# Patient Record
Sex: Female | Born: 1945 | Race: White | Hispanic: No | Marital: Married | Smoking: Former smoker
Health system: Southern US, Community
[De-identification: ages and names within clinical notes are randomized; demographics above are authoritative.]

## PROBLEM LIST (undated history)

## (undated) DIAGNOSIS — F32A Depression, unspecified: Secondary | ICD-10-CM

## (undated) DIAGNOSIS — C801 Malignant (primary) neoplasm, unspecified: Secondary | ICD-10-CM

## (undated) DIAGNOSIS — F329 Major depressive disorder, single episode, unspecified: Secondary | ICD-10-CM

## (undated) DIAGNOSIS — H9192 Unspecified hearing loss, left ear: Secondary | ICD-10-CM

## (undated) DIAGNOSIS — E039 Hypothyroidism, unspecified: Secondary | ICD-10-CM

## (undated) DIAGNOSIS — I639 Cerebral infarction, unspecified: Secondary | ICD-10-CM

## (undated) DIAGNOSIS — I2510 Atherosclerotic heart disease of native coronary artery without angina pectoris: Secondary | ICD-10-CM

## (undated) DIAGNOSIS — K219 Gastro-esophageal reflux disease without esophagitis: Secondary | ICD-10-CM

## (undated) DIAGNOSIS — M1990 Unspecified osteoarthritis, unspecified site: Secondary | ICD-10-CM

## (undated) DIAGNOSIS — I10 Essential (primary) hypertension: Secondary | ICD-10-CM

## (undated) DIAGNOSIS — Z8719 Personal history of other diseases of the digestive system: Secondary | ICD-10-CM

## (undated) HISTORY — PX: WRIST SURGERY: SHX841

## (undated) HISTORY — PX: ANKLE SURGERY: SHX546

## (undated) HISTORY — PX: APPENDECTOMY: SHX54

## (undated) HISTORY — PX: CHOLECYSTECTOMY: SHX55

## (undated) HISTORY — PX: OTHER SURGICAL HISTORY: SHX169

## (undated) HISTORY — PX: TONSILLECTOMY: SUR1361

## (undated) HISTORY — PX: CARDIAC CATHETERIZATION: SHX172

---

## 2018-12-19 HISTORY — PX: CATARACT EXTRACTION: SUR2

## 2018-12-31 ENCOUNTER — Other Ambulatory Visit: Payer: Self-pay | Admitting: Neurosurgery

## 2018-12-31 ENCOUNTER — Other Ambulatory Visit: Payer: Self-pay | Admitting: Surgical Oncology

## 2018-12-31 ENCOUNTER — Other Ambulatory Visit (HOSPITAL_BASED_OUTPATIENT_CLINIC_OR_DEPARTMENT_OTHER): Payer: Self-pay | Admitting: Neurosurgery

## 2018-12-31 DIAGNOSIS — M25552 Pain in left hip: Secondary | ICD-10-CM

## 2018-12-31 DIAGNOSIS — M5416 Radiculopathy, lumbar region: Secondary | ICD-10-CM

## 2019-01-05 ENCOUNTER — Ambulatory Visit (HOSPITAL_BASED_OUTPATIENT_CLINIC_OR_DEPARTMENT_OTHER)
Admission: RE | Admit: 2019-01-05 | Discharge: 2019-01-05 | Disposition: A | Discharge status: Home / Self Care | Payer: Medicare Other | Source: Ambulatory Visit | Attending: Neurosurgery | Admitting: Neurosurgery

## 2019-01-05 DIAGNOSIS — M5416 Radiculopathy, lumbar region: Secondary | ICD-10-CM | POA: Diagnosis present

## 2019-01-05 DIAGNOSIS — M25552 Pain in left hip: Secondary | ICD-10-CM | POA: Diagnosis present

## 2019-01-24 ENCOUNTER — Other Ambulatory Visit: Payer: Self-pay | Admitting: Neurosurgery

## 2019-02-15 NOTE — Pre-Procedure Instructions (Signed)
Sherene Plancarte  02/15/2019      [id number] - [place], [place] - [address] AT [address] [place] [address] Phone: [phone] Fax: [phone]    Your procedure is scheduled on March 10  Report to [facility] Admitting at 1010 A.M.  Call this number if you have problems the morning of surgery:  [phone]   Remember:  Do not eat or drink after midnight.    Take these medicines the morning of surgery with A SIP OF WATER  acetaminophen (TYLENOL) albuterol (PROVENTIL) (2.5 MG/3ML)  BREO ELLIPTA  DULoxetine (CYMBALTA) fluticasone (FLONASE) pantoprazole (PROTONIX)  SYNTHROID    7 days prior to surgery STOP taking any Aspirin (unless otherwise instructed by your surgeon), Aleve, Naproxen, Ibuprofen, Motrin, Advil, Goody's, BC's, all herbal medications, fish oil, and all vitamins.  Follow your surgeon's instructions on when to stop Asprin.  If no instructions were given by your surgeon then you will need to call the office to get those instructions.     Do not wear jewelry, make-up or nail polish.  Do not wear lotions, powders, or perfumes, or deodorant.  Do not shave 48 hours prior to surgery.    Do not bring valuables to the hospital.  [facility] is not responsible for any belongings or valuables.  Contacts, dentures or bridgework may not be worn into surgery.  Leave your suitcase in the car.  After surgery it may be brought to your room.  For patients admitted to the hospital, discharge time will be determined by your treatment team.  Patients discharged the day of surgery will not be allowed to drive home.    Special instructions:   [place]- Preparing For Surgery  Before surgery, you can play an important role. Because skin is not sterile, your skin needs to be as free of germs as possible. You can reduce the number of germs on your skin by washing with CHG (chlorahexidine gluconate) Soap  before surgery.  CHG is an antiseptic cleaner which kills germs and bonds with the skin to continue killing germs even after washing.    Oral Hygiene is also important to reduce your risk of infection.  Remember - BRUSH YOUR TEETH THE MORNING OF SURGERY WITH YOUR REGULAR TOOTHPASTE  Please do not use if you have an allergy to CHG or antibacterial soaps. If your skin becomes reddened/irritated stop using the CHG.  Do not shave (including legs and underarms) for at least 48 hours prior to first CHG shower. It is OK to shave your face.  Please follow these instructions carefully.   1. Shower the NIGHT BEFORE SURGERY and the MORNING OF SURGERY with CHG.   2. If you chose to wash your hair, wash your hair first as usual with your normal shampoo.  3. After you shampoo, rinse your hair and body thoroughly to remove the shampoo.  4. Use CHG as you would any other liquid soap. You can apply CHG directly to the skin and wash gently with a scrungie or a clean washcloth.   5. Apply the CHG Soap to your body ONLY FROM THE NECK DOWN.  Do not use on open wounds or open sores. Avoid contact with your eyes, ears, mouth and genitals (private parts). Wash Face and genitals (private parts)  with your normal soap.  6. Wash thoroughly, paying special attention to the area where your surgery will be performed.  7. Thoroughly rinse your body with warm water from the neck down.  8. DO NOT shower/wash with your normal soap after using and rinsing off the CHG Soap.  9. Pat yourself dry with a CLEAN TOWEL.  10. Wear CLEAN PAJAMAS to bed the night before surgery, wear comfortable clothes the morning of surgery  11. Place CLEAN SHEETS on your bed the night of your first shower and DO NOT SLEEP WITH PETS.    Day of Surgery:  Do not apply any deodorants/lotions.  Please wear clean clothes to the hospital/surgery center.   Remember to brush your teeth WITH YOUR REGULAR TOOTHPASTE.    Please read over the  following fact sheets that you were given.

## 2019-02-18 ENCOUNTER — Other Ambulatory Visit: Payer: Self-pay | Admitting: Neurosurgery

## 2019-02-18 ENCOUNTER — Other Ambulatory Visit: Payer: Self-pay

## 2019-02-18 ENCOUNTER — Encounter (HOSPITAL_COMMUNITY)
Admission: RE | Admit: 2019-02-18 | Discharge: 2019-02-18 | Disposition: A | Discharge status: Home / Self Care | Payer: Medicare Other | Source: Ambulatory Visit | Attending: Neurosurgery | Admitting: Neurosurgery

## 2019-02-18 ENCOUNTER — Encounter (HOSPITAL_COMMUNITY): Payer: Self-pay

## 2019-02-18 DIAGNOSIS — Z01812 Encounter for preprocedural laboratory examination: Secondary | ICD-10-CM | POA: Diagnosis present

## 2019-02-18 HISTORY — DX: Malignant (primary) neoplasm, unspecified: C80.1

## 2019-02-18 HISTORY — DX: Cerebral infarction, unspecified: I63.9

## 2019-02-18 HISTORY — DX: Major depressive disorder, single episode, unspecified: F32.9

## 2019-02-18 HISTORY — DX: Hypothyroidism, unspecified: E03.9

## 2019-02-18 HISTORY — DX: Unspecified osteoarthritis, unspecified site: M19.90

## 2019-02-18 HISTORY — DX: Personal history of other diseases of the digestive system: Z87.19

## 2019-02-18 HISTORY — DX: Atherosclerotic heart disease of native coronary artery without angina pectoris: I25.10

## 2019-02-18 HISTORY — DX: Depression, unspecified: F32.A

## 2019-02-18 HISTORY — DX: Essential (primary) hypertension: I10

## 2019-02-18 HISTORY — DX: Unspecified hearing loss, left ear: H91.92

## 2019-02-18 HISTORY — DX: Gastro-esophageal reflux disease without esophagitis: K21.9

## 2019-02-18 LAB — BASIC METABOLIC PANEL
Anion gap: 10 (ref 5–15)
BUN: 34 mg/dL — AB (ref 8–23)
CO2: 23 mmol/L (ref 22–32)
Calcium: 9.3 mg/dL (ref 8.9–10.3)
Chloride: 105 mmol/L (ref 98–111)
Creatinine, Ser: 0.94 mg/dL (ref 0.44–1.00)
GFR calc Af Amer: >60 mL/min (ref >60)
GFR calc non Af Amer: 60 mL/min — ABNORMAL LOW (ref >60)
Glucose, Bld: 139 mg/dL — ABNORMAL HIGH (ref 70–99)
Potassium: 3.6 mmol/L (ref 3.5–5.1)
Sodium: 138 mmol/L (ref 135–145)

## 2019-02-18 LAB — CBC
HCT: 40.8 % (ref 36.0–46.0)
Hemoglobin: 13.2 g/dL (ref 12.0–15.0)
MCH: 30.1 pg (ref 26.0–34.0)
MCHC: 32.4 g/dL (ref 30.0–36.0)
MCV: 93.2 fL (ref 80.0–100.0)
Platelets: 362 10*3/uL (ref 150–400)
RBC: 4.38 MIL/uL (ref 3.87–5.11)
RDW: 13.2 % (ref 11.5–15.5)
WBC: 9 10*3/uL (ref 4.0–10.5)
nRBC: 0 % (ref 0.0–0.2)

## 2019-02-18 LAB — SURGICAL PCR SCREEN
MRSA, PCR: NEGATIVE
Staphylococcus aureus: NEGATIVE

## 2019-02-18 NOTE — Progress Notes (Signed)
Blood bank called to notify that patient is positive for antibodies. Will need recollection on DOS. Blood bank states to use same armband but obtain a new sample.   Jacqlyn Larsen, RN

## 2019-02-18 NOTE — Progress Notes (Signed)
PCP - Dr. Yong Channel Cardiologist - Dr. Theron Arista [facility]. Pt states that she has received cardiac clearance that was sent to [facility].  Chest x-ray - N/A EKG - 02/09/19-requested from Dr. Rhetta Mura office.  Stress Test - 04/11/18-CE ECHO - 06/04/18-CE; pt states having another ECHO 12/2018. Requested from [place] records.  Cardiac Cath - 15+ years ago; states 2 stents were placed.  Sleep Study - denies CPAP - denies  Blood Thinner Instructions: N/A Aspirin Instructions:Per cardiologist; hold 10 days prior to procedure. LD 02/15/19 per pt.   Anesthesia review: Yes, heart history and pending records.   Patient denies shortness of breath, fever, cough and chest pain at PAT appointment   Patient verbalized understanding of instructions that were given to them at the PAT appointment. Patient was also instructed that they will need to review over the PAT instructions again at home before surgery.

## 2019-02-19 NOTE — Progress Notes (Addendum)
Anesthesia Chart Review:  Case:  [id number] Date/Time:  02/26/19 1159   Procedure:  Left Lumbar 4-5 Transforaminal lumbar interbody fusion, possible posterior (Left Back) - Left Lumbar 4-5 Transforaminal lumbar interbody fusion, possible posterior   Anesthesia type:  General   Pre-op diagnosis:  Spondylolisthesis, Lumbar region   Location:  [facility] OR ROOM 21 / [facility]   Surgeon:  Erline Levine, MD      DISCUSSION: Patient is a 73 year old female scheduled for the above procedure.  History includes never smoker, CAD (s/p LAD stents x2 2006), HTN, post-ablation hypothyroidism, CVA 2018, left ear deafness, GERD, hiatal hernia, skin cancer (SCC, right forearm 08/2018), dyslipidemia (intolerant to statins). - Admitted 01/29/19-01/30/19 for recurrent syncope (referred to ED from PCP office). 01/29/19. BP range from 99/59-192/101. Syncope felt likely due to orthostatic hypotension. By notes EKG showed prolonged QT. Head CT negative for acute findings. Echo ordered and out-patient follow-up for consideration of loop recorder. Normal LVEF without significant valvular abnormality on echo.  - She was seen by cardiologist Dr. Wyline Copas on 02/08/19 for preoperative cardiology evaluation. He thought she may proceed with surgery at "acceptable risk."  She reported last ASA 02/15/19 per cardiologist.  Although patient has cardiac clearance, his note does not mention recent admission for syncope x4 or consideration of cardiac monitoring or loop recorder. I have reached out to Dr. Rhetta Mura office to clarify nothing additional needed prior to surgery. I was told his last day there was on 02/15/19, but nursing staff will contact me for follow-up.    ADDENDUM 02/25/19 1:06 PM: Cardiologist Mathis Bud, MD advised that "if she has indeed had 4 syncopal episodes in that period of time that she needs a 30-day event monitor placed and then to be cleared by someone in the office after that information is available." Janett Billow spoke with patient  who insists that Dr Wyline Copas was aware of syncope and thought it was related to dehydration and vertigo, and since increasing her fluid intake has not had additional syncope. Discussed with anesthesiologist Suzette Battiest, MD. Patient can discuss further with covering cardiologist. If clearance for surgery is confirmed without additional preoperative testing then would anticipate that she can proceed as scheduled, but if preoperative event monitor is still recommended then elective surgery would need to be postponed. Jessica at Dr. Donald Pore' office again updated.   VS: BP 138/68   Pulse 88   Temp (!) 36.4 C   Resp 20   Ht 5\' 5"  (1.651 m)   Wt 87.9 kg   SpO2 100%   BMI 32.25 kg/m    PROVIDERS: Kristopher Glee., MD is PCP ([place]) Sherryl Barters, MD is cardiologist ([place])   LABS: Labs reviewed: Acceptable for surgery. (all labs ordered are listed, but only abnormal results are displayed)  Labs Reviewed  BASIC METABOLIC PANEL - Abnormal; Notable for the following components:      Result Value   Glucose, Bld 139 (*)    BUN 34 (*)    GFR calc non Af Amer 60 (*)    All other components within normal limits  SURGICAL PCR SCREEN  CBC  TYPE AND SCREEN    IMAGES: CXR 01/29/19 ([facility] [place]): FINDINGS: The heart size and mediastinal contours are within normal limits. Mild uncoiling of the thoracic aorta with aortic atherosclerosis. No aneurysm. Both lungs are clear. Chronic mild elevation of the right hemidiaphragm. Osteoarthritis of the included AC joints. No acute nor suspicious osseous  abnormality. Degenerative changes are present along the dorsal spine. IMPRESSION: No active cardiopulmonary disease.  CT head 01/29/19 [facility] [place]): IMPRESSION: Chronic ischemic change without acute abnormality.   EKG: 02/08/19 [facility] Poor baseline.  Sinus rhythm.  Possible LVH.   Otherwise normal.  When compared to ECG from 01/29/19 16: 58, premature atrial complexes are no longer present.  QT has shortened (QT 406/QTc 438 ms).  Confirmed by Mathis Bud, [facility]   CV: Echo 01/30/19: Summary Technically difficult and limited study. Grossly normal left ventricular function. Ejection fraction is visually estimated at 60-65% No significant valvular abnormalities.  Nuclear stress test 04/11/18 [facility] [place]) : Summary Both stress and rest images demonstrate normal uptake in all walls. The calculated EF is 67%. There is no evidence of inducible ischemia on this study.   Past Medical History:  Diagnosis Date  . Arthritis   . Cancer (HCC)    squamous cell carcinoma  . Coronary artery disease   . Deafness in left ear   . Depression   . GERD (gastroesophageal reflux disease)   . History of hiatal hernia   . Hypertension   . Hypothyroidism    thyroid ablation 20 years ago  . Stroke [facility])    2018-"2 minor strokes"    Past Surgical History:  Procedure Laterality Date  . ANKLE SURGERY Right   . ANKLE SURGERY Left   . APPENDECTOMY    . CARDIAC CATHETERIZATION     stents x2; 20+ years ago  . CESAREAN SECTION     x2  . CHOLECYSTECTOMY    . thyroid ablation    . TONSILLECTOMY    . [facility] in [place], [place] Left    Done in [place], [place]    MEDICATIONS: . acetaminophen (TYLENOL) 500 MG tablet  . albuterol (PROVENTIL) (2.5 MG/3ML) 0.083% nebulizer solution  . ASPIRIN LOW DOSE 81 MG EC tablet  . BREO ELLIPTA 100-25 MCG/INH AEPB  . Cholecalciferol (VITAMIN D) 50 MCG (2000 UT) CAPS  . DULoxetine (CYMBALTA) 30 MG capsule  . DULoxetine (CYMBALTA) 60 MG capsule  . fluticasone (FLONASE) 50 MCG/ACT nasal spray  . losartan (COZAAR) 50 MG tablet  . naproxen sodium (ALEVE) 220 MG tablet  . pantoprazole (PROTONIX) 40 MG tablet  . PRALUENT 75 MG/ML SOAJ  . pyridOXINE (VITAMIN B-6) 100 MG tablet  .  SYNTHROID 112 MCG tablet  . vitamin B-12 (CYANOCOBALAMIN) 1000 MCG tablet   No current facility-administered medications for this encounter.     Myra Gianotti, PA-C Surgical Short Stay/Anesthesiology [facility] Phone [phone] [facility] Phone [phone] 02/19/2019 2:45 PM

## 2019-02-23 NOTE — H&P (Signed)
Patient ID:   [id number] Patient: Jane Daniels  Date of Birth: 04-05-1946 Visit Type: Office Visit   Date: 01/23/2019 01:15 PM Provider: Marchia Meiers. Vertell Limber MD   This 73 year old female presents for back and leg /MRI review.  HISTORY OF PRESENT ILLNESS:  1.  back and leg /MRI review  Patient returns to review lumbar and hip MRI  Lumbar MRI demonstrates L4-5 spondylolisthesis.  The patient has severe spinal stenosis at this level.  She does have some degenerative changes at the L5-S1 facet joints but these are not terribly severe.  She has significant nerve root compression left greater than right at the L4-5 level.  The patient is continuing to have significant back and left leg pain.  She has left EHL at 4/5.  Based on these imaging findings and her persistent pain and weakness I have recommended the patient undergo surgery.  This will consist of a left L4-5 TLIF versus PLIF.  Risks and benefits were discussed in detail with the patient and she wished to proceed for surgery.  This has been scheduled for March 10th, 2020.         Medical/Surgical/Interim History Reviewed, no change.  Last detailed document date:12/28/2018.     PAST MEDICAL HISTORY, SURGICAL HISTORY, FAMILY HISTORY, SOCIAL HISTORY AND REVIEW OF SYSTEMS I have reviewed the patient's past medical, surgical, family and social history as well as the comprehensive review of systems as included on the [facility] history form dated 12/28/2018, which I have signed.  Family History:  Reviewed, no changes.  Last detailed document date:12/28/2018.   Social History: Reviewed, no changes. Last detailed document date: 12/28/2018.    MEDICATIONS: (added, continued or stopped this visit) Started Medication Directions Instruction Stopped   albuterol sulfate 2.5 mg/3 mL (0.083 %) solution for nebulization inhale 3 milliliter by nebulization route 3 times every day     aspirin 81 mg tablet,delayed  release take 1 tablet by oral route  every day     Breo Ellipta 100 mcg-25 mcg/dose powder for inhalation inhale 1 puff by inhalation route  every day at the same time each day     clonazepam 0.5 mg tablet take 1 tablet by oral route  every day     Cymbalta 60 mg capsule,delayed release take 1 capsule by oral route  every day    12/28/2018 Flonase Allergy Relief 50 mcg/actuation nasal spray,suspension spray 1 - 2 spray by intranasal route  every day in each nostril as needed     hydrocodone 5 mg-acetaminophen 325 mg tablet take 1 tablet by oral route  every 6 hours as needed for pain     losartan 50 mg tablet take 1 tablet by oral route  every day     pantoprazole 40 mg tablet,delayed release take 1 tablet by oral route  every day     Praluent Pen 75 mg/mL subcutaneous pen injector inject 1 milliliter by subcutaneous route  every 2 weeks in the abdomen, thigh, or upper arm rotating injection sites     ProAir HFA 90 mcg/actuation aerosol inhaler inhale 2 puff by inhalation route  every 4 - 6 hours as needed     Tirosint 112 mcg capsule take 1 capsule by oral route  every day     Vitamin B-6 25 mg tablet      Vitamin D3 1,000 unit capsule        ALLERGIES: Ingredient Reaction Medication Name Comment  FENTANYL  AMLODIPINE     HYDROMORPHONE HCL  Dilaudid   METOLAZONE  Zaroxolyn   PREGABALIN     LISINOPRIL         PHYSICAL EXAM:   Vitals Date Temp F BP Pulse Ht In Wt Lb BMI BSA Pain Score  01/23/2019  169/94 98 65.5 193 31.63  4/10      IMPRESSION:   Lumbar stenosis with spondylolisthesis and persistent left leg pain with weakness.  PLAN:  Proceed with decompression and fusion L4-5 level either left TLIF or PLIF.  Risks and benefits were discussed in detail with patient and she wished to proceed.  She was fitted for lumbosacral orthosis today.  Orders: Diagnostic Procedures: Assessment Procedure  M54.16 Lumbar Spine- AP/Lat  Instruction(s)/Education: Assessment  Instruction  I10 Hypertension education  Z68.31 Lifestyle education regarding diet  Miscellaneous: Assessment   M43.16 Aspen Lo Sag Rigid Panel Quick   Assessment/Plan   # Detail Type Description   1. Assessment Low back pain, unspecified back pain laterality, with sciatica presence unspecified (M54.5).       2. Assessment Lumbar foraminal stenosis (M48.061).       3. Assessment Spondylolisthesis, lumbar region (M43.16).   Plan Orders Aspen Lo Sag Rigid Panel Quick.       4. Assessment Lumbar radiculopathy (M54.16).       5. Assessment Essential (primary) hypertension (I10).       6. Assessment Body mass index (BMI) 31.0-31.9, adult (Z68.31).   Plan Orders Today's instructions / counseling include(s) Lifestyle education regarding diet. Clinical information/comments: Patient encouraged to eat a well balance diet.         Pain Management Plan Pain Scale: 4/10. Method: Numeric Pain Intensity Scale.  Fall Risk Plan The patient has not fallen in the last year. Falls risk follow-up plan of care: Assisted devices: Patient taking medication as prescribed.              Provider:  Marchia Meiers. Vertell Limber MD  01/26/2019 03:52 PM Dictation edited by: Marchia Meiers. Vertell Limber    CC Providers: Yong Channel [address] [place],  [place]  [phone]   [place]  [address] [place], [place] [address]               Electronically signed by Marchia Meiers. Vertell Limber MD on 01/26/2019 03:52 PM

## 2019-02-23 NOTE — H&P (View-Only) (Signed)
Patient ID:   [id number] Patient: Jane Daniels  Date of Birth: 02-20-1946 Visit Type: Office Visit   Date: 01/23/2019 01:15 PM Provider: Marchia Meiers. Vertell Limber MD   This 73 year old female presents for back and leg /MRI review.  HISTORY OF PRESENT ILLNESS:  1.  back and leg /MRI review  Patient returns to review lumbar and hip MRI  Lumbar MRI demonstrates L4-5 spondylolisthesis.  The patient has severe spinal stenosis at this level.  She does have some degenerative changes at the L5-S1 facet joints but these are not terribly severe.  She has significant nerve root compression left greater than right at the L4-5 level.  The patient is continuing to have significant back and left leg pain.  She has left EHL at 4/5.  Based on these imaging findings and her persistent pain and weakness I have recommended the patient undergo surgery.  This will consist of a left L4-5 TLIF versus PLIF.  Risks and benefits were discussed in detail with the patient and she wished to proceed for surgery.  This has been scheduled for March 10th, 2020.         Medical/Surgical/Interim History Reviewed, no change.  Last detailed document date:12/28/2018.     PAST MEDICAL HISTORY, SURGICAL HISTORY, FAMILY HISTORY, SOCIAL HISTORY AND REVIEW OF SYSTEMS I have reviewed the patient's past medical, surgical, family and social history as well as the comprehensive review of systems as included on the [facility] history form dated 12/28/2018, which I have signed.  Family History:  Reviewed, no changes.  Last detailed document date:12/28/2018.   Social History: Reviewed, no changes. Last detailed document date: 12/28/2018.    MEDICATIONS: (added, continued or stopped this visit) Started Medication Directions Instruction Stopped   albuterol sulfate 2.5 mg/3 mL (0.083 %) solution for nebulization inhale 3 milliliter by nebulization route 3 times every day     aspirin 81 mg tablet,delayed  release take 1 tablet by oral route  every day     Breo Ellipta 100 mcg-25 mcg/dose powder for inhalation inhale 1 puff by inhalation route  every day at the same time each day     clonazepam 0.5 mg tablet take 1 tablet by oral route  every day     Cymbalta 60 mg capsule,delayed release take 1 capsule by oral route  every day    12/28/2018 Flonase Allergy Relief 50 mcg/actuation nasal spray,suspension spray 1 - 2 spray by intranasal route  every day in each nostril as needed     hydrocodone 5 mg-acetaminophen 325 mg tablet take 1 tablet by oral route  every 6 hours as needed for pain     losartan 50 mg tablet take 1 tablet by oral route  every day     pantoprazole 40 mg tablet,delayed release take 1 tablet by oral route  every day     Praluent Pen 75 mg/mL subcutaneous pen injector inject 1 milliliter by subcutaneous route  every 2 weeks in the abdomen, thigh, or upper arm rotating injection sites     ProAir HFA 90 mcg/actuation aerosol inhaler inhale 2 puff by inhalation route  every 4 - 6 hours as needed     Tirosint 112 mcg capsule take 1 capsule by oral route  every day     Vitamin B-6 25 mg tablet      Vitamin D3 1,000 unit capsule        ALLERGIES: Ingredient Reaction Medication Name Comment  FENTANYL  AMLODIPINE     HYDROMORPHONE HCL  Dilaudid   METOLAZONE  Zaroxolyn   PREGABALIN     LISINOPRIL         PHYSICAL EXAM:   Vitals Date Temp F BP Pulse Ht In Wt Lb BMI BSA Pain Score  01/23/2019  169/94 98 65.5 193 31.63  4/10      IMPRESSION:   Lumbar stenosis with spondylolisthesis and persistent left leg pain with weakness.  PLAN:  Proceed with decompression and fusion L4-5 level either left TLIF or PLIF.  Risks and benefits were discussed in detail with patient and she wished to proceed.  She was fitted for lumbosacral orthosis today.  Orders: Diagnostic Procedures: Assessment Procedure  M54.16 Lumbar Spine- AP/Lat  Instruction(s)/Education: Assessment  Instruction  I10 Hypertension education  Z68.31 Lifestyle education regarding diet  Miscellaneous: Assessment   M43.16 Aspen Lo Sag Rigid Panel Quick   Assessment/Plan   # Detail Type Description   1. Assessment Low back pain, unspecified back pain laterality, with sciatica presence unspecified (M54.5).       2. Assessment Lumbar foraminal stenosis (M48.061).       3. Assessment Spondylolisthesis, lumbar region (M43.16).   Plan Orders Aspen Lo Sag Rigid Panel Quick.       4. Assessment Lumbar radiculopathy (M54.16).       5. Assessment Essential (primary) hypertension (I10).       6. Assessment Body mass index (BMI) 31.0-31.9, adult (Z68.31).   Plan Orders Today's instructions / counseling include(s) Lifestyle education regarding diet. Clinical information/comments: Patient encouraged to eat a well balance diet.         Pain Management Plan Pain Scale: 4/10. Method: Numeric Pain Intensity Scale.  Fall Risk Plan The patient has not fallen in the last year. Falls risk follow-up plan of care: Assisted devices: Patient taking medication as prescribed.              Provider:  Marchia Meiers. Vertell Limber MD  01/26/2019 03:52 PM Dictation edited by: Marchia Meiers. Vertell Limber    CC Providers: Yong Channel [address] [place],  [place]  [phone]   [place]  [address] [place], [place] [address]               Electronically signed by Marchia Meiers. Vertell Limber MD on 01/26/2019 03:52 PM

## 2019-02-25 NOTE — Anesthesia Preprocedure Evaluation (Addendum)
Anesthesia Evaluation  Patient identified by MRN, date of birth, ID band Patient awake    Reviewed: Allergy & Precautions, NPO status , Patient's Chart, lab work & pertinent test results  Airway Mallampati: II  TM Distance: >3 FB Neck ROM: Full    Dental  (+) Edentulous Upper, Missing,    Pulmonary neg pulmonary ROS,    Pulmonary exam normal breath sounds clear to auscultation       Cardiovascular hypertension, Pt. on medications + CAD and + Cardiac Stents  Normal cardiovascular exam Rhythm:Regular Rate:Normal  Sees cardiologist ([place])/ ([place])    Neuro/Psych PSYCHIATRIC DISORDERS Depression Deafness in left ear TIACVA, No Residual Symptoms    GI/Hepatic Neg liver ROS, hiatal hernia, GERD  Medicated and Controlled,  Endo/Other  Hypothyroidism   Renal/GU negative Renal ROS     Musculoskeletal negative musculoskeletal ROS (+)   Abdominal (+) + obese,   Peds  Hematology negative hematology ROS (+)   Anesthesia Other Findings Spondylolisthesis, Lumbar region  Reproductive/Obstetrics                          Anesthesia Physical Anesthesia Plan  ASA: III  Anesthesia Plan: General   Post-op Pain Management:    Induction: Intravenous  PONV Risk Score and Plan: 4 or greater and Ondansetron, Dexamethasone, Midazolam and Treatment may vary due to age or medical condition  Airway Management Planned: Oral ETT  Additional Equipment:   Intra-op Plan:   Post-operative Plan: Extubation in OR  Informed Consent: I have reviewed the patients History and Physical, chart, labs and discussed the procedure including the risks, benefits and alternatives for the proposed anesthesia with the patient or authorized representative who has indicated his/her understanding and acceptance.     Dental advisory given  Plan Discussed with: CRNA  Anesthesia Plan Comments: (Reviewed TWO NOTES by Myra Gianotti, PA-C. Question of propofol intolerance noted in 02/25/19 cardiology notes, but not documented from her PAT visit. Patient took acetaminophen 1000 mg at 0800 on 02/26/19)      Anesthesia Quick Evaluation

## 2019-02-25 NOTE — Progress Notes (Signed)
Anesthesia follow-up: See my PAT APP note from encounter 02/18/19. Since then patient spoke with cardiologist Dr. Vaughan Browner nurse Jane Daniels. As mentioned in my previous note, patient insists that Dr. Earlie Daniels was aware of syncope and thought it was related to dehydration. (This is supported by Dr. Vincente Daniels 12/17/18 note, although he does not make mention of subsequent four episodes in his 02/08/19--although he does mention the echo results which was done as part of her overnight work-up for syncope 01/29/19.).   Per Jane Daniels, Dr. Beatrix Daniels initially thought question regarding event monitor pertained to four episodes that occurred after her 02/08/19 visit with Dr. Earlie Daniels when in fact, he had seen patient after the episodes. Also per Jane Daniels, since Dr. Earlie Daniels had seen patient following her syncopal events and had cleared her for surgery and because she had not had any recurrent symptoms then Dr. Beatrix Daniels did not feel that patient needed to undergo event monitoring prior to surgery and that Dr. Vincente Daniels original clearance should still be valid. Per Dr. Melven Sartorius PA Jane Daniels, Dr. Beatrix Daniels communicated this to him personally as well.  I had discussed with anesthesiologist Jane Battiest, MD the back and forth communication with myself, Dr. Melven Sartorius scheduler and his PA, and Dr. Beatrix Daniels and his staff. Dr. Ola Daniels wanted Dr. Beatrix Daniels to clarify that event monitor was not needed prior to surgery. I notified Jane Daniels at Dr. Melven Sartorius office of this. Since then notation by Jane Klinefelter, RN (see Jane Daniels) wrote: "I spoke with the patient and reviewed Dr. Rhetta Daniels last couple of OV notes her syncopal episodes happened over the last 4 months all occurring prior to her visit with Dr. Wyline Daniels on 02/08/2019 therefore she is considered pre-op assessed and is acceptable risk per Dr. Wyline Daniels for back (lumbar fusion) surgery. Her "syncopal" episodes were due to different reasons, dehydration on one occasion, vertigo causing a fall where she hit her head, and  reaction to medication she reported as being given propofol despite her being intolerant/allergic to it. I informed Jane Daniels with anesthesia and the surgeon was notified by Dr. Beatrix Daniels of this as they paged him about this. Patient is also aware to arrive for surgery tomorrow unless the surgeon call and tell her otherwise."  Anesthesiologist will evaluate on the day of surgery and review available records. Definitive plan at that time. Of note, patient did not mention intolerance to propofol at her PAT visit, so I was not aware of any issues until late this afternoon, so anesthesiologist can inquire further upon his/her interview.  Jane Gianotti, PA-C Surgical Short Stay/Anesthesiology [facility] Phone [phone] [facility] Phone [phone] 02/25/2019 5:13 PM

## 2019-02-26 ENCOUNTER — Inpatient Hospital Stay (HOSPITAL_COMMUNITY): Payer: Medicare Other

## 2019-02-26 ENCOUNTER — Encounter (HOSPITAL_COMMUNITY): Admission: RE | Payer: Self-pay | Source: Home / Self Care

## 2019-02-26 ENCOUNTER — Observation Stay (HOSPITAL_COMMUNITY)
Admission: RE | Admit: 2019-02-26 | Discharge: 2019-02-27 | Disposition: A | Discharge status: Home / Self Care | Payer: Medicare Other | Attending: Neurosurgery | Admitting: Neurosurgery

## 2019-02-26 ENCOUNTER — Inpatient Hospital Stay (HOSPITAL_COMMUNITY): Payer: Medicare Other | Admitting: Vascular Surgery

## 2019-02-26 ENCOUNTER — Encounter (HOSPITAL_COMMUNITY): Payer: Self-pay

## 2019-02-26 ENCOUNTER — Encounter (HOSPITAL_COMMUNITY)
Admission: RE | Disposition: A | Discharge status: Home / Self Care | Payer: Self-pay | Source: Home / Self Care | Attending: Neurosurgery

## 2019-02-26 ENCOUNTER — Other Ambulatory Visit: Payer: Self-pay

## 2019-02-26 ENCOUNTER — Inpatient Hospital Stay (HOSPITAL_COMMUNITY): Admission: RE | Admit: 2019-02-26 | Payer: Medicare Other | Source: Home / Self Care | Admitting: Neurosurgery

## 2019-02-26 DIAGNOSIS — I2510 Atherosclerotic heart disease of native coronary artery without angina pectoris: Secondary | ICD-10-CM | POA: Diagnosis not present

## 2019-02-26 DIAGNOSIS — M4316 Spondylolisthesis, lumbar region: Principal | ICD-10-CM | POA: Insufficient documentation

## 2019-02-26 DIAGNOSIS — Z419 Encounter for procedure for purposes other than remedying health state, unspecified: Secondary | ICD-10-CM

## 2019-02-26 DIAGNOSIS — H9192 Unspecified hearing loss, left ear: Secondary | ICD-10-CM | POA: Diagnosis not present

## 2019-02-26 DIAGNOSIS — F329 Major depressive disorder, single episode, unspecified: Secondary | ICD-10-CM | POA: Diagnosis not present

## 2019-02-26 DIAGNOSIS — Z7982 Long term (current) use of aspirin: Secondary | ICD-10-CM | POA: Insufficient documentation

## 2019-02-26 DIAGNOSIS — M5416 Radiculopathy, lumbar region: Secondary | ICD-10-CM | POA: Diagnosis not present

## 2019-02-26 DIAGNOSIS — Z888 Allergy status to other drugs, medicaments and biological substances status: Secondary | ICD-10-CM | POA: Diagnosis not present

## 2019-02-26 DIAGNOSIS — E669 Obesity, unspecified: Secondary | ICD-10-CM | POA: Diagnosis not present

## 2019-02-26 DIAGNOSIS — K449 Diaphragmatic hernia without obstruction or gangrene: Secondary | ICD-10-CM | POA: Insufficient documentation

## 2019-02-26 DIAGNOSIS — Z885 Allergy status to narcotic agent status: Secondary | ICD-10-CM | POA: Diagnosis not present

## 2019-02-26 DIAGNOSIS — I10 Essential (primary) hypertension: Secondary | ICD-10-CM | POA: Diagnosis not present

## 2019-02-26 DIAGNOSIS — K219 Gastro-esophageal reflux disease without esophagitis: Secondary | ICD-10-CM | POA: Diagnosis not present

## 2019-02-26 DIAGNOSIS — Z79899 Other long term (current) drug therapy: Secondary | ICD-10-CM | POA: Insufficient documentation

## 2019-02-26 DIAGNOSIS — M48061 Spinal stenosis, lumbar region without neurogenic claudication: Secondary | ICD-10-CM | POA: Diagnosis not present

## 2019-02-26 DIAGNOSIS — Z955 Presence of coronary angioplasty implant and graft: Secondary | ICD-10-CM | POA: Diagnosis not present

## 2019-02-26 DIAGNOSIS — E039 Hypothyroidism, unspecified: Secondary | ICD-10-CM | POA: Insufficient documentation

## 2019-02-26 DIAGNOSIS — Z6831 Body mass index (BMI) 31.0-31.9, adult: Secondary | ICD-10-CM | POA: Diagnosis not present

## 2019-02-26 HISTORY — PX: TRANSFORAMINAL LUMBAR INTERBODY FUSION (TLIF) WITH PEDICLE SCREW FIXATION 1 LEVEL: SHX6141

## 2019-02-26 LAB — TYPE AND SCREEN
ABO/RH(D): O POS
Antibody Screen: POSITIVE

## 2019-02-26 SURGERY — TRANSFORAMINAL LUMBAR INTERBODY FUSION (TLIF) WITH PEDICLE SCREW FIXATION 1 LEVEL
Anesthesia: General | Site: Back | Laterality: Left

## 2019-02-26 MED ORDER — 0.9 % SODIUM CHLORIDE (POUR BTL) OPTIME
TOPICAL | Status: DC | PRN
  Administered 2019-02-26: 1000 mL

## 2019-02-26 MED ORDER — THROMBIN 5000 UNITS EX SOLR
OROMUCOSAL | Status: DC | PRN
  Administered 2019-02-26: 5 mL via TOPICAL

## 2019-02-26 MED ORDER — ACETAMINOPHEN 500 MG PO TABS: 500.0000 mg | ORAL_TABLET | Freq: Four times a day (QID) | ORAL | Status: DC | PRN

## 2019-02-26 MED ORDER — SODIUM CHLORIDE 0.9 % IV SOLN
INTRAVENOUS | Status: DC | PRN
  Administered 2019-02-26: 30 ug/min via INTRAVENOUS

## 2019-02-26 MED ORDER — BISACODYL 10 MG RE SUPP: 10.0000 mg | Freq: Every day | RECTAL | Status: DC | PRN

## 2019-02-26 MED ORDER — VITAMIN D 25 MCG (1000 UNIT) PO TABS
2000.0000 [IU] | ORAL_TABLET | Freq: Every day | ORAL | Status: DC
  Administered 2019-02-27: 2000 [IU] via ORAL
  Filled 2019-02-26 (×2): qty 2

## 2019-02-26 MED ORDER — LACTATED RINGERS IV SOLN
INTRAVENOUS | Status: DC
  Administered 2019-02-26 (×2): via INTRAVENOUS

## 2019-02-26 MED ORDER — ALIROCUMAB 75 MG/ML ~~LOC~~ SOAJ: 75.0000 mg | SUBCUTANEOUS | Status: DC

## 2019-02-26 MED ORDER — ACETAMINOPHEN 325 MG PO TABS: 650.0000 mg | ORAL_TABLET | ORAL | Status: DC | PRN

## 2019-02-26 MED ORDER — SODIUM CHLORIDE 0.9% FLUSH: 3.0000 mL | INTRAVENOUS | Status: DC | PRN

## 2019-02-26 MED ORDER — ROCURONIUM BROMIDE 10 MG/ML (PF) SYRINGE
PREFILLED_SYRINGE | INTRAVENOUS | Status: DC | PRN
  Administered 2019-02-26: 50 mg via INTRAVENOUS
  Administered 2019-02-26: 20 mg via INTRAVENOUS

## 2019-02-26 MED ORDER — MENTHOL 3 MG MT LOZG: 1.0000 | LOZENGE | OROMUCOSAL | Status: DC | PRN

## 2019-02-26 MED ORDER — BUPIVACAINE LIPOSOME 1.3 % IJ SUSP
20.0000 mL | INTRAMUSCULAR | Status: AC
  Administered 2019-02-26: 20 mL
  Filled 2019-02-26: qty 20

## 2019-02-26 MED ORDER — ZOLPIDEM TARTRATE 5 MG PO TABS: 5.0000 mg | ORAL_TABLET | Freq: Every evening | ORAL | Status: DC | PRN

## 2019-02-26 MED ORDER — CHLORHEXIDINE GLUCONATE CLOTH 2 % EX PADS: 6.0000 | MEDICATED_PAD | Freq: Once | CUTANEOUS | Status: DC

## 2019-02-26 MED ORDER — LIDOCAINE-EPINEPHRINE 1 %-1:100000 IJ SOLN
INTRAMUSCULAR | Status: AC
  Filled 2019-02-26: qty 1

## 2019-02-26 MED ORDER — KCL IN DEXTROSE-NACL 20-5-0.45 MEQ/L-%-% IV SOLN: INTRAVENOUS | Status: DC

## 2019-02-26 MED ORDER — DULOXETINE HCL 30 MG PO CPEP
90.0000 mg | ORAL_CAPSULE | Freq: Every morning | ORAL | Status: DC
  Administered 2019-02-27: 90 mg via ORAL
  Filled 2019-02-26: qty 3

## 2019-02-26 MED ORDER — ACETAMINOPHEN 650 MG RE SUPP: 650.0000 mg | RECTAL | Status: DC | PRN

## 2019-02-26 MED ORDER — FENTANYL CITRATE (PF) 250 MCG/5ML IJ SOLN
INTRAMUSCULAR | Status: DC | PRN
  Administered 2019-02-26 (×4): 50 ug via INTRAVENOUS
  Administered 2019-02-26: 150 ug via INTRAVENOUS
  Administered 2019-02-26: 50 ug via INTRAVENOUS

## 2019-02-26 MED ORDER — ACETAMINOPHEN 10 MG/ML IV SOLN: 1000.0000 mg | Freq: Once | INTRAVENOUS | Status: DC | PRN

## 2019-02-26 MED ORDER — KETOROLAC TROMETHAMINE 15 MG/ML IJ SOLN
15.0000 mg | Freq: Once | INTRAMUSCULAR | Status: AC | PRN
  Administered 2019-02-26: 15 mg via INTRAVENOUS

## 2019-02-26 MED ORDER — ONDANSETRON HCL 4 MG/2ML IJ SOLN: 4.0000 mg | Freq: Once | INTRAMUSCULAR | Status: DC | PRN

## 2019-02-26 MED ORDER — SUGAMMADEX SODIUM 200 MG/2ML IV SOLN
INTRAVENOUS | Status: DC | PRN
  Administered 2019-02-26: 200 mg via INTRAVENOUS

## 2019-02-26 MED ORDER — PHENOL 1.4 % MT LIQD: 1.0000 | OROMUCOSAL | Status: DC | PRN

## 2019-02-26 MED ORDER — FLUTICASONE PROPIONATE 50 MCG/ACT NA SUSP
2.0000 | Freq: Every day | NASAL | Status: DC
  Filled 2019-02-26: qty 16

## 2019-02-26 MED ORDER — ONDANSETRON HCL 4 MG PO TABS: 4.0000 mg | ORAL_TABLET | Freq: Four times a day (QID) | ORAL | Status: DC | PRN

## 2019-02-26 MED ORDER — FENTANYL CITRATE (PF) 100 MCG/2ML IJ SOLN
INTRAMUSCULAR | Status: AC
  Administered 2019-02-26: 50 ug via INTRAVENOUS
  Filled 2019-02-26: qty 2

## 2019-02-26 MED ORDER — SODIUM CHLORIDE 0.9% FLUSH
3.0000 mL | Freq: Two times a day (BID) | INTRAVENOUS | Status: DC
  Administered 2019-02-26: 3 mL via INTRAVENOUS

## 2019-02-26 MED ORDER — THROMBIN 5000 UNITS EX SOLR
CUTANEOUS | Status: AC
  Filled 2019-02-26: qty 5000

## 2019-02-26 MED ORDER — PANTOPRAZOLE SODIUM 40 MG PO TBEC
40.0000 mg | DELAYED_RELEASE_TABLET | Freq: Every day | ORAL | Status: DC
  Administered 2019-02-27: 40 mg via ORAL
  Filled 2019-02-26: qty 1

## 2019-02-26 MED ORDER — KETOROLAC TROMETHAMINE 15 MG/ML IJ SOLN
INTRAMUSCULAR | Status: AC
  Filled 2019-02-26: qty 1

## 2019-02-26 MED ORDER — ALUM & MAG HYDROXIDE-SIMETH 200-200-20 MG/5ML PO SUSP: 30.0000 mL | Freq: Four times a day (QID) | ORAL | Status: DC | PRN

## 2019-02-26 MED ORDER — SODIUM CHLORIDE 0.9 % IV SOLN: 250.0000 mL | INTRAVENOUS | Status: DC

## 2019-02-26 MED ORDER — PANTOPRAZOLE SODIUM 40 MG IV SOLR: 40.0000 mg | Freq: Every day | INTRAVENOUS | Status: DC

## 2019-02-26 MED ORDER — ASPIRIN EC 81 MG PO TBEC
81.0000 mg | DELAYED_RELEASE_TABLET | Freq: Every day | ORAL | Status: DC
  Administered 2019-02-27: 81 mg via ORAL
  Filled 2019-02-26: qty 1

## 2019-02-26 MED ORDER — ALBUTEROL SULFATE (2.5 MG/3ML) 0.083% IN NEBU: 2.5000 mg | INHALATION_SOLUTION | Freq: Four times a day (QID) | RESPIRATORY_TRACT | Status: DC | PRN

## 2019-02-26 MED ORDER — ACETAMINOPHEN 500 MG PO TABS
1000.0000 mg | ORAL_TABLET | Freq: Once | ORAL | Status: DC
  Filled 2019-02-26: qty 2

## 2019-02-26 MED ORDER — LOSARTAN POTASSIUM 50 MG PO TABS
50.0000 mg | ORAL_TABLET | Freq: Every day | ORAL | Status: DC
  Administered 2019-02-27: 50 mg via ORAL
  Filled 2019-02-26: qty 1

## 2019-02-26 MED ORDER — MORPHINE SULFATE (PF) 2 MG/ML IV SOLN: 2.0000 mg | INTRAVENOUS | Status: DC | PRN

## 2019-02-26 MED ORDER — LEVOTHYROXINE SODIUM 112 MCG PO TABS
112.0000 ug | ORAL_TABLET | Freq: Every day | ORAL | Status: DC
  Administered 2019-02-27: 112 ug via ORAL
  Filled 2019-02-26: qty 1

## 2019-02-26 MED ORDER — FLEET ENEMA 7-19 GM/118ML RE ENEM: 1.0000 | ENEMA | Freq: Once | RECTAL | Status: DC | PRN

## 2019-02-26 MED ORDER — PHENYLEPHRINE 40 MCG/ML (10ML) SYRINGE FOR IV PUSH (FOR BLOOD PRESSURE SUPPORT)
PREFILLED_SYRINGE | INTRAVENOUS | Status: DC | PRN
  Administered 2019-02-26: 80 ug via INTRAVENOUS
  Administered 2019-02-26: 40 ug via INTRAVENOUS
  Administered 2019-02-26: 80 ug via INTRAVENOUS

## 2019-02-26 MED ORDER — CEFAZOLIN SODIUM-DEXTROSE 2-4 GM/100ML-% IV SOLN
2.0000 g | INTRAVENOUS | Status: AC
  Administered 2019-02-26: 2 g via INTRAVENOUS

## 2019-02-26 MED ORDER — ONDANSETRON HCL 4 MG/2ML IJ SOLN
INTRAMUSCULAR | Status: DC | PRN
  Administered 2019-02-26: 4 mg via INTRAVENOUS

## 2019-02-26 MED ORDER — HYDROCODONE-ACETAMINOPHEN 5-325 MG PO TABS: 2.0000 | ORAL_TABLET | ORAL | Status: DC | PRN

## 2019-02-26 MED ORDER — DEXAMETHASONE SODIUM PHOSPHATE 10 MG/ML IJ SOLN
INTRAMUSCULAR | Status: DC | PRN
  Administered 2019-02-26: 5 mg via INTRAVENOUS

## 2019-02-26 MED ORDER — POLYETHYLENE GLYCOL 3350 17 G PO PACK: 17.0000 g | PACK | Freq: Every day | ORAL | Status: DC | PRN

## 2019-02-26 MED ORDER — THROMBIN 20000 UNITS EX SOLR
CUTANEOUS | Status: DC | PRN
  Administered 2019-02-26: 20 mL via TOPICAL

## 2019-02-26 MED ORDER — VITAMIN B-6 100 MG PO TABS
100.0000 mg | ORAL_TABLET | Freq: Every day | ORAL | Status: DC
  Administered 2019-02-27: 100 mg via ORAL
  Filled 2019-02-26: qty 1

## 2019-02-26 MED ORDER — BUPIVACAINE HCL (PF) 0.5 % IJ SOLN
INTRAMUSCULAR | Status: DC | PRN
  Administered 2019-02-26: 5 mL

## 2019-02-26 MED ORDER — DOCUSATE SODIUM 100 MG PO CAPS
100.0000 mg | ORAL_CAPSULE | Freq: Two times a day (BID) | ORAL | Status: DC
  Administered 2019-02-27: 100 mg via ORAL
  Filled 2019-02-26 (×2): qty 1

## 2019-02-26 MED ORDER — LIDOCAINE-EPINEPHRINE 1 %-1:100000 IJ SOLN
INTRAMUSCULAR | Status: DC | PRN
  Administered 2019-02-26: 5 mL

## 2019-02-26 MED ORDER — PROPOFOL 10 MG/ML IV BOLUS
INTRAVENOUS | Status: DC | PRN
  Administered 2019-02-26: 200 mg via INTRAVENOUS

## 2019-02-26 MED ORDER — METHOCARBAMOL 500 MG PO TABS
500.0000 mg | ORAL_TABLET | Freq: Four times a day (QID) | ORAL | Status: DC | PRN
  Administered 2019-02-26 – 2019-02-27 (×2): 500 mg via ORAL
  Filled 2019-02-26 (×2): qty 1

## 2019-02-26 MED ORDER — CEFAZOLIN SODIUM-DEXTROSE 2-4 GM/100ML-% IV SOLN
INTRAVENOUS | Status: AC
  Filled 2019-02-26: qty 100

## 2019-02-26 MED ORDER — FLUTICASONE FUROATE-VILANTEROL 100-25 MCG/INH IN AEPB
1.0000 | INHALATION_SPRAY | Freq: Every day | RESPIRATORY_TRACT | Status: DC
  Administered 2019-02-27: 1 via RESPIRATORY_TRACT
  Filled 2019-02-26: qty 28

## 2019-02-26 MED ORDER — FENTANYL CITRATE (PF) 100 MCG/2ML IJ SOLN
25.0000 ug | INTRAMUSCULAR | Status: DC | PRN
  Administered 2019-02-26 (×2): 25 ug via INTRAVENOUS
  Administered 2019-02-26: 50 ug via INTRAVENOUS

## 2019-02-26 MED ORDER — VITAMIN B-12 1000 MCG PO TABS
1000.0000 ug | ORAL_TABLET | Freq: Every day | ORAL | Status: DC
  Administered 2019-02-27: 1000 ug via ORAL
  Filled 2019-02-26: qty 1

## 2019-02-26 MED ORDER — DULOXETINE HCL 30 MG PO CPEP: 30.0000 mg | ORAL_CAPSULE | Freq: Every morning | ORAL | Status: DC

## 2019-02-26 MED ORDER — BUPIVACAINE HCL (PF) 0.5 % IJ SOLN
INTRAMUSCULAR | Status: AC
  Filled 2019-02-26: qty 30

## 2019-02-26 MED ORDER — THROMBIN 20000 UNITS EX SOLR
CUTANEOUS | Status: AC
  Filled 2019-02-26: qty 20000

## 2019-02-26 MED ORDER — ONDANSETRON HCL 4 MG/2ML IJ SOLN: 4.0000 mg | Freq: Four times a day (QID) | INTRAMUSCULAR | Status: DC | PRN

## 2019-02-26 MED ORDER — CEFAZOLIN SODIUM-DEXTROSE 2-4 GM/100ML-% IV SOLN
2.0000 g | Freq: Three times a day (TID) | INTRAVENOUS | Status: AC
  Administered 2019-02-26 – 2019-02-27 (×2): 2 g via INTRAVENOUS
  Filled 2019-02-26 (×2): qty 100

## 2019-02-26 MED ORDER — OXYCODONE HCL 5 MG PO TABS
5.0000 mg | ORAL_TABLET | ORAL | Status: DC | PRN
  Administered 2019-02-26 – 2019-02-27 (×4): 5 mg via ORAL
  Filled 2019-02-26 (×4): qty 1

## 2019-02-26 MED ORDER — METHOCARBAMOL 1000 MG/10ML IJ SOLN
500.0000 mg | Freq: Four times a day (QID) | INTRAVENOUS | Status: DC | PRN
  Filled 2019-02-26: qty 5

## 2019-02-26 MED ORDER — LIDOCAINE 2% (20 MG/ML) 5 ML SYRINGE
INTRAMUSCULAR | Status: DC | PRN
  Administered 2019-02-26: 60 mg via INTRAVENOUS

## 2019-02-26 SURGICAL SUPPLY — 76 items
BASKET BONE COLLECTION (BASKET) ×3 IMPLANT
BLADE CLIPPER SURG (BLADE) IMPLANT
BONE CANC CHIPS 20CC PCAN1/4 (Bone Implant) ×3 IMPLANT
BUR MATCHSTICK NEURO 3.0 LAGG (BURR) ×3 IMPLANT
BUR PRECISION FLUTE 5.0 (BURR) ×3 IMPLANT
CAGE PLIF 8X9X23-12 LUMBAR (Cage) ×6 IMPLANT
CANISTER SUCT 3000ML PPV (MISCELLANEOUS) ×3 IMPLANT
CARTRIDGE OIL MAESTRO DRILL (MISCELLANEOUS) ×1 IMPLANT
CHIPS CANC BONE 20CC PCAN1/4 (Bone Implant) ×1 IMPLANT
CONT SPEC 4OZ CLIKSEAL STRL BL (MISCELLANEOUS) ×6 IMPLANT
COVER BACK TABLE 24X17X13 BIG (DRAPES) IMPLANT
COVER BACK TABLE 60X90IN (DRAPES) ×3 IMPLANT
COVER WAND RF STERILE (DRAPES) ×3 IMPLANT
DECANTER SPIKE VIAL GLASS SM (MISCELLANEOUS) ×3 IMPLANT
DERMABOND ADVANCED (GAUZE/BANDAGES/DRESSINGS) ×2
DERMABOND ADVANCED .7 DNX12 (GAUZE/BANDAGES/DRESSINGS) ×1 IMPLANT
DIFFUSER DRILL AIR PNEUMATIC (MISCELLANEOUS) ×3 IMPLANT
DRAPE C-ARM 42X72 X-RAY (DRAPES) ×3 IMPLANT
DRAPE C-ARMOR (DRAPES) ×3 IMPLANT
DRAPE LAPAROTOMY 100X72X124 (DRAPES) ×3 IMPLANT
DRAPE POUCH INSTRU U-SHP 10X18 (DRAPES) ×3 IMPLANT
DRAPE SURG 17X23 STRL (DRAPES) ×3 IMPLANT
DRSG OPSITE POSTOP 4X6 (GAUZE/BANDAGES/DRESSINGS) ×3 IMPLANT
DURAPREP 26ML APPLICATOR (WOUND CARE) ×3 IMPLANT
ELECT BLADE 4.0 EZ CLEAN MEGAD (MISCELLANEOUS) ×3
ELECT REM PT RETURN 9FT ADLT (ELECTROSURGICAL) ×3
ELECTRODE BLDE 4.0 EZ CLN MEGD (MISCELLANEOUS) ×1 IMPLANT
ELECTRODE REM PT RTRN 9FT ADLT (ELECTROSURGICAL) ×1 IMPLANT
GAUZE 4X4 16PLY RFD (DISPOSABLE) IMPLANT
GAUZE SPONGE 4X4 12PLY STRL (GAUZE/BANDAGES/DRESSINGS) IMPLANT
GLOVE BIO SURGEON STRL SZ8 (GLOVE) ×6 IMPLANT
GLOVE BIOGEL PI IND STRL 8 (GLOVE) ×2 IMPLANT
GLOVE BIOGEL PI IND STRL 8.5 (GLOVE) ×2 IMPLANT
GLOVE BIOGEL PI INDICATOR 8 (GLOVE) ×4
GLOVE BIOGEL PI INDICATOR 8.5 (GLOVE) ×4
GLOVE ECLIPSE 8.0 STRL XLNG CF (GLOVE) ×6 IMPLANT
GLOVE EXAM NITRILE XL STR (GLOVE) IMPLANT
GOWN STRL REUS W/ TWL LRG LVL3 (GOWN DISPOSABLE) ×1 IMPLANT
GOWN STRL REUS W/ TWL XL LVL3 (GOWN DISPOSABLE) ×3 IMPLANT
GOWN STRL REUS W/TWL 2XL LVL3 (GOWN DISPOSABLE) ×6 IMPLANT
GOWN STRL REUS W/TWL LRG LVL3 (GOWN DISPOSABLE) ×2
GOWN STRL REUS W/TWL XL LVL3 (GOWN DISPOSABLE) ×6
HEMOSTAT POWDER KIT SURGIFOAM (HEMOSTASIS) ×3 IMPLANT
KIT BASIN OR (CUSTOM PROCEDURE TRAY) ×3 IMPLANT
KIT POSITION SURG JACKSON T1 (MISCELLANEOUS) ×3 IMPLANT
KIT TURNOVER KIT B (KITS) ×3 IMPLANT
MILL MEDIUM DISP (BLADE) ×3 IMPLANT
NEEDLE HYPO 21X1.5 SAFETY (NEEDLE) ×3 IMPLANT
NEEDLE HYPO 25X1 1.5 SAFETY (NEEDLE) ×3 IMPLANT
NEEDLE SPNL 18GX3.5 QUINCKE PK (NEEDLE) IMPLANT
NS IRRIG 1000ML POUR BTL (IV SOLUTION) ×3 IMPLANT
OIL CARTRIDGE MAESTRO DRILL (MISCELLANEOUS) ×3
PACK LAMINECTOMY NEURO (CUSTOM PROCEDURE TRAY) ×3 IMPLANT
PAD ARMBOARD 7.5X6 YLW CONV (MISCELLANEOUS) ×9 IMPLANT
PATTIES SURGICAL .5 X.5 (GAUZE/BANDAGES/DRESSINGS) IMPLANT
PATTIES SURGICAL .5 X1 (DISPOSABLE) IMPLANT
PATTIES SURGICAL 1X1 (DISPOSABLE) IMPLANT
ROD RELINE-O LORD 5.5X35MM (Rod) ×6 IMPLANT
SCREW LOCK RELINE 5.5 TULIP (Screw) ×12 IMPLANT
SCREW RELINE-O POLY 6.5X40 (Screw) ×6 IMPLANT
SCREW RELINE-O POLY 6.5X45 (Screw) ×6 IMPLANT
SPONGE LAP 4X18 RFD (DISPOSABLE) IMPLANT
SPONGE SURGIFOAM ABS GEL 100 (HEMOSTASIS) ×3 IMPLANT
STAPLER SKIN PROX WIDE 3.9 (STAPLE) IMPLANT
SUT VIC AB 1 CT1 18XBRD ANBCTR (SUTURE) ×2 IMPLANT
SUT VIC AB 1 CT1 8-18 (SUTURE) ×4
SUT VIC AB 2-0 CT1 18 (SUTURE) ×6 IMPLANT
SUT VIC AB 3-0 SH 8-18 (SUTURE) ×6 IMPLANT
SYR 20CC LL (SYRINGE) ×3 IMPLANT
SYR 3ML LL SCALE MARK (SYRINGE) ×6 IMPLANT
SYR 5ML LL (SYRINGE) IMPLANT
TOWEL GREEN STERILE (TOWEL DISPOSABLE) ×3 IMPLANT
TOWEL GREEN STERILE FF (TOWEL DISPOSABLE) ×3 IMPLANT
TRAP SPECIMEN MUCOUS 40CC (MISCELLANEOUS) ×3 IMPLANT
TRAY FOLEY MTR SLVR 16FR STAT (SET/KITS/TRAYS/PACK) ×3 IMPLANT
WATER STERILE IRR 1000ML POUR (IV SOLUTION) ×3 IMPLANT

## 2019-02-26 NOTE — Anesthesia Postprocedure Evaluation (Signed)
Anesthesia Post Note  Patient: Jane Daniels  Procedure(s) Performed: Lumbar four-five posterior lumbar interbody fusion (Left Back)     Patient location during evaluation: PACU Anesthesia Type: General Level of consciousness: awake and alert Pain management: pain level controlled Vital Signs Assessment: post-procedure vital signs reviewed and stable Respiratory status: spontaneous breathing, nonlabored ventilation, respiratory function stable and patient connected to nasal cannula oxygen Cardiovascular status: blood pressure returned to baseline and stable Postop Assessment: no apparent nausea or vomiting Anesthetic complications: no    Last Vitals:  Vitals:   02/26/19 1757 02/26/19 1931  BP: (!) 168/88 (!) 158/80  Pulse: 86 89  Resp: 18 18  Temp: 36.5 C 36.4 C  SpO2: 94% 93%    Last Pain:  Vitals:   02/26/19 1932  TempSrc:   PainSc: 5                  Jamarl Pew P Ariyanah Aguado

## 2019-02-26 NOTE — Brief Op Note (Signed)
02/26/2019  4:51 PM  PATIENT:  Jane Daniels  73 y.o. female  PRE-OPERATIVE DIAGNOSIS:  Spondylolisthesis, Lumbar region, lumbar stenosis, facet arthropathy, radiculopathy, lumbago L 45 level  POST-OPERATIVE DIAGNOSIS:  Spondylolisthesis, Lumbar region, lumbar stenosis, facet arthropathy, radiculopathy, lumbago L 45 level   PROCEDURE:  Procedure(s) with comments: Lumbar four-five posterior lumbar interbody fusion - Lumbar four-five posterior lumbar interbody fusion with PEEK cages, autograft, pedicle screw fixation, posterolateral arthrodesis  SURGEON:  Surgeon(s) and Role:    Erline Levine, MD - Primary    * Earnie Larsson, MD - Assisting  PHYSICIAN ASSISTANT:   ASSISTANTS: Poteat, RN   ANESTHESIA:   general  EBL:  150 mL   BLOOD ADMINISTERED:none  DRAINS: none   LOCAL MEDICATIONS USED:  MARCAINE    and LIDOCAINE   SPECIMEN:  No Specimen  DISPOSITION OF SPECIMEN:  N/A  COUNTS:  YES  TOURNIQUET:  * No tourniquets in log *  DICTATION: Patient is 73 year old woman with mobile spondylolisthesis of L4 on L5 with lumbar stenosis. She has a left L5 radiculopathy. It was elected to take her to surgery for decompression and fusion at this level.   Procedure: Patient was placed in a prone position on the [place] table after smooth and uncomplicated induction of general endotracheal anesthesia. Her low back was prepped and draped in usual sterile fashion with betadine scrub and DuraPrep after marking area of planned incision with X ray. Area of incision was infiltrated with local lidocaine. Incision was made to the lumbodorsal fascia was incised and exposure was performed of the L4 through L5 spinous processes laminae facet joint and transverse processes. Intraoperative x-ray was obtained which confirmed correct orientation. A total laminectomy of L4 was performed with disarticulation of the facet joints at this level and thorough decompression was performed of both L4 and L5 nerve roots  along with the common dural tube. This decompression was more involved than would be typical of that performed for PLIF alone and included painstaking dissection of adherent ligament compressing the thecal sac and wide decompression of all neural elements. A thorough discectomy was initially performed on the left with preparation of the endplates for grafting a trial spacer was placed this level and a thorough discectomy was performed on the right as well.  Bilateral 8 x 9 x 58mm  X 12 degrees peek cages were packed with local autograft and was inserted the interspace and countersunk appropriately along with 10 cc of morselized bone autograft. The posterolateral region was extensively decorticated and pedicle probes were placed at L4 and L5 bilaterally. Intraoperative fluoroscopy confirmed correct orientationin the AP and lateral plane. 40 x 6.5 mm pedicle screws were placed at L5 bilaterally and 45 x 6.5 mm screws placed at L4 bilaterally final x-rays demonstrated well-positioned interbody grafts and pedicle screw fixation. A 35 mm lordotic rod was placed on the right and a 35 mm rod was placed on the left locked down in situ and the posterolateral region was packed with 20 cc autograft mixed with allograft chips bilaterally. The wound was irrigated. Long-acting Marcaine was injected in the deep musculature.  Fascia was closed with 1 Vicryl sutures skin edges were reapproximated 2 and 3-0 Vicryl sutures. The wound is dressed with Dermabond and an occlusive dressing.  the patient was extubated in the operating room and taken to recovery in stable satisfactory condition. She tolerated the operation well counts were correct at the end of the case.   PLAN OF CARE: Admit to inpatient  PATIENT DISPOSITION:  PACU - hemodynamically stable.   Delay start of Pharmacological VTE agent (>24hrs) due to surgical blood loss or risk of bleeding: yes

## 2019-02-26 NOTE — Op Note (Signed)
02/26/2019  4:51 PM  PATIENT:  Jane Daniels  73 y.o. female  PRE-OPERATIVE DIAGNOSIS:  Spondylolisthesis, Lumbar region, lumbar stenosis, facet arthropathy, radiculopathy, lumbago L 45 level  POST-OPERATIVE DIAGNOSIS:  Spondylolisthesis, Lumbar region, lumbar stenosis, facet arthropathy, radiculopathy, lumbago L 45 level   PROCEDURE:  Procedure(s) with comments: Lumbar four-five posterior lumbar interbody fusion - Lumbar four-five posterior lumbar interbody fusion with PEEK cages, autograft, pedicle screw fixation, posterolateral arthrodesis  SURGEON:  Surgeon(s) and Role:    Erline Levine, MD - Primary    * Earnie Larsson, MD - Assisting  PHYSICIAN ASSISTANT:   ASSISTANTS: Poteat, RN   ANESTHESIA:   general  EBL:  150 mL   BLOOD ADMINISTERED:none  DRAINS: none   LOCAL MEDICATIONS USED:  MARCAINE    and LIDOCAINE   SPECIMEN:  No Specimen  DISPOSITION OF SPECIMEN:  N/A  COUNTS:  YES  TOURNIQUET:  * No tourniquets in log *  DICTATION: Patient is 73 year old woman with mobile spondylolisthesis of L4 on L5 with lumbar stenosis. She has a left L5 radiculopathy. It was elected to take her to surgery for decompression and fusion at this level.   Procedure: Patient was placed in a prone position on the [place] table after smooth and uncomplicated induction of general endotracheal anesthesia. Her low back was prepped and draped in usual sterile fashion with betadine scrub and DuraPrep after marking area of planned incision with X ray. Area of incision was infiltrated with local lidocaine. Incision was made to the lumbodorsal fascia was incised and exposure was performed of the L4 through L5 spinous processes laminae facet joint and transverse processes. Intraoperative x-ray was obtained which confirmed correct orientation. A total laminectomy of L4 was performed with disarticulation of the facet joints at this level and thorough decompression was performed of both L4 and L5 nerve roots  along with the common dural tube. This decompression was more involved than would be typical of that performed for PLIF alone and included painstaking dissection of adherent ligament compressing the thecal sac and wide decompression of all neural elements. A thorough discectomy was initially performed on the left with preparation of the endplates for grafting a trial spacer was placed this level and a thorough discectomy was performed on the right as well.  Bilateral 8 x 9 x 66mm  X 12 degrees peek cages were packed with local autograft and was inserted the interspace and countersunk appropriately along with 10 cc of morselized bone autograft. The posterolateral region was extensively decorticated and pedicle probes were placed at L4 and L5 bilaterally. Intraoperative fluoroscopy confirmed correct orientationin the AP and lateral plane. 40 x 6.5 mm pedicle screws were placed at L5 bilaterally and 45 x 6.5 mm screws placed at L4 bilaterally final x-rays demonstrated well-positioned interbody grafts and pedicle screw fixation. A 35 mm lordotic rod was placed on the right and a 35 mm rod was placed on the left locked down in situ and the posterolateral region was packed with 20 cc autograft mixed with allograft chips bilaterally. The wound was irrigated. Long-acting Marcaine was injected in the deep musculature.  Fascia was closed with 1 Vicryl sutures skin edges were reapproximated 2 and 3-0 Vicryl sutures. The wound is dressed with Dermabond and an occlusive dressing.  the patient was extubated in the operating room and taken to recovery in stable satisfactory condition. She tolerated the operation well counts were correct at the end of the case.   PLAN OF CARE: Admit to inpatient  PATIENT DISPOSITION:  PACU - hemodynamically stable.   Delay start of Pharmacological VTE agent (>24hrs) due to surgical blood loss or risk of bleeding: yes

## 2019-02-26 NOTE — Plan of Care (Signed)
  Problem: Education: Goal: Ability to verbalize activity precautions or restrictions will improve Outcome: Progressing Goal: Knowledge of the prescribed therapeutic regimen will improve Outcome: Progressing Goal: Understanding of discharge needs will improve Outcome: Progressing   Problem: Activity: Goal: Ability to avoid complications of mobility impairment will improve Outcome: Progressing Goal: Ability to tolerate increased activity will improve Outcome: Progressing Goal: Will remain free from falls Outcome: Progressing   Problem: Pain Management: Goal: Pain level will decrease Outcome: Progressing   Problem: Safety: Goal: Ability to remain free from injury will improve Outcome: Progressing

## 2019-02-26 NOTE — Transfer of Care (Signed)
Immediate Anesthesia Transfer of Care Note  Patient: Jane Daniels  Procedure(s) Performed: Lumbar four-five posterior lumbar interbody fusion (Left Back)  Patient Location: PACU  Anesthesia Type:General  Level of Consciousness: awake, alert  and patient cooperative  Airway & Oxygen Therapy: Patient Spontanous Breathing and Patient connected to nasal cannula oxygen  Post-op Assessment: Report given to RN and Post -op Vital signs reviewed and stable  Post vital signs: Reviewed and stable  Last Vitals:  Vitals Value Taken Time  BP 175/86 02/26/2019  4:45 PM  Temp 36.2 C 02/26/2019  4:45 PM  Pulse 88 02/26/2019  4:45 PM  Resp 9 02/26/2019  4:45 PM  SpO2 93 % 02/26/2019  4:45 PM    Last Pain:  Vitals:   02/26/19 1259  PainSc: 3       Patients Stated Pain Goal: 3 (90/21/11 5520)  Complications: No apparent anesthesia complications

## 2019-02-26 NOTE — Anesthesia Procedure Notes (Signed)
Procedure Name: Intubation Date/Time: 02/26/2019 2:03 PM Performed by: Alain Marion, CRNA Pre-anesthesia Checklist: Patient identified, Emergency Drugs available, Suction available and Patient being monitored Patient Re-evaluated:Patient Re-evaluated prior to induction Oxygen Delivery Method: Circle System Utilized Preoxygenation: Pre-oxygenation with 100% oxygen Induction Type: IV induction Ventilation: Mask ventilation without difficulty Laryngoscope Size: Miller and 2 Grade View: Grade I Tube type: Oral Tube size: 7.0 mm Number of attempts: 1 Airway Equipment and Method: Stylet and Oral airway Placement Confirmation: ETT inserted through vocal cords under direct vision,  positive ETCO2 and breath sounds checked- equal and bilateral Secured at: 22 cm Tube secured with: Tape Dental Injury: Teeth and Oropharynx as per pre-operative assessment

## 2019-02-26 NOTE — Progress Notes (Signed)
Awake, Alert, conversant.  MAEW with good strength.  No numbness.  Doing well.

## 2019-02-26 NOTE — Interval H&P Note (Signed)
History and Physical Interval Note:  02/26/2019 1:17 PM  Jane Daniels  has presented today for surgery, with the diagnosis of Spondylolisthesis, Lumbar region.  The various methods of treatment have been discussed with the patient and family. After consideration of risks, benefits and other options for treatment, the patient has consented to  Procedure(s) with comments: Left Lumbar 4-5 Transforaminal lumbar interbody fusion/possible posterior (Left) - Left Lumbar 4-5 Transforaminal lumbar interbody fusion/possible posterior as a surgical intervention.  The patient's history has been reviewed, patient examined, no change in status, stable for surgery.  I have reviewed the patient's chart and labs.  Questions were answered to the patient's satisfaction.     Peggyann Shoals

## 2019-02-27 ENCOUNTER — Encounter (HOSPITAL_COMMUNITY): Payer: Self-pay | Admitting: Neurosurgery

## 2019-02-27 DIAGNOSIS — M4316 Spondylolisthesis, lumbar region: Secondary | ICD-10-CM | POA: Diagnosis not present

## 2019-02-27 MED ORDER — METHOCARBAMOL 500 MG PO TABS: 500.0000 mg | ORAL_TABLET | Freq: Four times a day (QID) | ORAL | 1 refills | Status: DC | PRN

## 2019-02-27 MED ORDER — OXYCODONE HCL 5 MG PO TABS: 5.0000 mg | ORAL_TABLET | ORAL | 0 refills | Status: DC | PRN

## 2019-02-27 NOTE — Discharge Instructions (Signed)
Wound Care Leave incision open to air. You may shower. Do not scrub directly on incision.  Do not put any creams, lotions, or ointments on incision. Activity Walk each and every day, increasing distance each day. No lifting greater than 5 lbs.  Avoid bending, arching, and twisting. No driving for 2 weeks; may ride as a passenger locally. If provided with back brace, wear when out of bed.  It is not necessary to wear in bed. Diet Resume your normal diet.  Return to Work Will be discussed at you follow up appointment. Call Your Doctor If Any of These Occur Redness, drainage, or swelling at the wound.  Temperature greater than 101 degrees. Severe pain not relieved by pain medication. Incision starts to come apart. Follow Up Appt Call today for appointment in 3-4 weeks ([phone]) or for problems.  If you have any hardware placed in your spine, you will need an x-ray before your appointment.   Spinal Fusion, Adult, Care After This sheet gives you information about how to care for yourself after your procedure. Your doctor may also give you more specific instructions. If you have problems or questions, contact your doctor. Follow these instructions at home: Medicines  Take over-the-counter and prescription medicines only as told by your doctor. These include any medicines for pain or blood-thinning medicines (anticoagulants).  If you were prescribed an antibiotic medicine, take it as told by your doctor. Do not stop taking the antibiotic even if you start to feel better.  Do not drive for 24 hours if you were given a medicine to help you relax (sedative) during your procedure.  Do not drive or use heavy machinery while taking prescription pain medicine. If you have a brace:  Wear the brace as told by your doctor. Take it off only as told by your doctor.  Keep the brace clean. Managing pain, stiffness, and swelling  If directed, put ice on the surgery area: ? If you have a  removable brace, take it off as told by your doctor. ? Put ice in a plastic bag. ? Place a towel between your skin and the bag. ? Leave the ice on for 20 minutes, 2-3 times a day. Surgery cut care   Follow instructions from your doctor about how to take care of your cut from surgery (incision). Make sure you: ? Wash your hands with soap and water before you change your bandage (dressing). If you cannot use soap and water, use hand sanitizer. ? Change your bandage as told by your doctor. ? Leave stitches (sutures), skin glue, or skin tape (adhesive) strips in place. They may need to stay in place for 2 weeks or longer. If tape strips get loose and curl up, you may trim the loose edges. Do not remove tape strips completely unless your doctor says it is okay.  Keep your cut from surgery clean and dry. ? Do not take baths, swim, or use a hot tub until your doctor says it is okay. ? Ask your doctor if you can take showers. You may only be allowed to take sponge baths.  Every day, check your cut from surgery and the area around it for: ? More redness, swelling, or pain. ? Fluid or blood. ? Warmth. ? Pus or a bad smell.  If you have a drain tube, follow instructions from your doctor about caring for it. Do not take out the drain tube or any bandages unless your doctor says it is okay. Physical activity  Rest  and protect your back as much as possible.  Follow instructions from your doctor about how to move. Use good posture to help your spine heal.  Do not lift anything that is heavier than 8 lb (3.6 kg), or the limit that you are told, until your doctor says that it is safe.  Do not twist or bend at the waist until your doctor says it is okay.  It is best if you: ? Do not make pushing and pulling motions. ? Do not sit or lie down in the same position for a long time. ? Do not raise your hands or arms above your head.  Return to your normal activities as told by your doctor. Ask your  doctor what activities are safe for you. Rest and protect your back as much as you can.  Do not start to exercise until your doctor says it is okay. Ask your doctor what kinds of exercise you can do to make your back stronger. General instructions  To prevent blood clots and lessen swelling in your legs: ? Wear compression stockings as told. ? Walk one or more times every few hours as told by your doctor.  Do not use any products that contain nicotine or tobacco, such as cigarettes and e-cigarettes. These can delay bone healing. If you need help quitting, ask your doctor.  To prevent or treat constipation while you are taking prescription pain medicine, your doctor may suggest that you: ? Drink enough fluid to keep your pee (urine) pale yellow. ? Take over-the-counter or prescription medicines. ? Eat foods that are high in fiber. These include fresh fruits and vegetables, whole grains, and beans. ? Limit foods that are high in fat and processed sugars, such as fried and sweet foods.  Keep all follow-up visits as told by your doctor. This is important. Contact a doctor if:  Your pain gets worse.  Your medicine does not help your pain.  Your legs or feet get painful or swollen.  Your cut from surgery is more red, swollen, or painful.  Your cut from surgery feels warm to the touch.  You have: ? Fluid or blood coming from your cut from surgery. ? Pus or a bad smell coming from your cut from surgery. ? A fever. ? Weakness or loss of feeling (numbness) in your legs that is new or getting worse. ? Trouble controlling when you pee (urinate) or poop (have a bowel movement).  You feel sick to your stomach (nauseous).  You throw up (vomit). Get help right away if:  Your pain is very bad.  You have chest pain.  You have trouble breathing.  You start to have a cough. These symptoms may be an emergency. Do not wait to see if the symptoms will go away. Get medical help right away.  Call your local emergency services (911 in the [place] Do not drive yourself to the hospital. Summary  After the procedure, it is common to have pain in your back and pain by your surgery cut(s).  Icing and pain medicines may help to control the pain. Follow directions from your doctor.  Rest and protect your back as much as possible. Do not twist or bend at the waist.  Get up and walk one or more times every few hours as told by your doctor. This information is not intended to replace advice given to you by your health care provider. Make sure you discuss any questions you have with your health care provider. Document  Released: 03/31/2011 Document Revised: 03/21/2017 Document Reviewed: 03/21/2017 Elsevier Interactive Patient Education  2019 [employer].

## 2019-02-27 NOTE — Care Management Obs Status (Signed)
[place] NOTIFICATION   Patient Details  Name: Jane Daniels MRN: [id number] Date of Birth: April 17, 1946   Medicare Observation Status Notification Given:  Yes    Ninfa Meeker, RN 02/27/2019, 10:29 AM

## 2019-02-27 NOTE — Progress Notes (Addendum)
Subjective: Patient reports "I'm doing well"  Objective: Vital signs in last 24 hours: Temp:  [97.2 F (36.2 C)-98.5 F (36.9 C)] 98.5 F (36.9 C) (03/11 0716) Pulse Rate:  [73-95] 73 (03/11 0716) Resp:  [0-21] 19 (03/11 0716) BP: (132-181)/(72-88) 139/74 (03/11 0716) SpO2:  [93 %-100 %] 98 % (03/11 0716) Weight:  [86.2 kg] 86.2 kg (03/10 1209)  Intake/Output from previous day: 03/10 0701 - 03/11 0700 In: 1303 [I.V.:1303] Out: 906 [Urine:756; Blood:150] Intake/Output this shift: No intake/output data recorded.  Alert, conversant. Reports only lumbar soreness. Incision without erythema, swelling, or drainage beneath honeycomb and Dermabond. Good strength BLE.   Lab Results: No results for input(s): WBC, HGB, HCT, PLT in the last 72 hours. BMET No results for input(s): NA, K, CL, CO2, GLUCOSE, BUN, CREATININE, CALCIUM in the last 72 hours.  Studies/Results: Dg Lumbar Spine 2-3 Views  Result Date: 02/26/2019 CLINICAL DATA:  Transforaminal lumbar interbody fusion EXAM: DG C-ARM 61-120 MIN; LUMBAR SPINE - 2-3 VIEW COMPARISON:  MRI 01/05/2019 FINDINGS: Two low resolution intraoperative spot views of the lumbar spine. Total fluoroscopy time was 43 seconds. Fixating screws and interbody device at L4-L5. IMPRESSION: Intraoperative fluoroscopic assistance provided during lumbar spine surgery Electronically Signed   By: Donavan Foil M.D.   On: 02/26/2019 19:39   Dg C-arm 1-60 Min  Result Date: 02/26/2019 CLINICAL DATA:  Transforaminal lumbar interbody fusion EXAM: DG C-ARM 61-120 MIN; LUMBAR SPINE - 2-3 VIEW COMPARISON:  MRI 01/05/2019 FINDINGS: Two low resolution intraoperative spot views of the lumbar spine. Total fluoroscopy time was 43 seconds. Fixating screws and interbody device at L4-L5. IMPRESSION: Intraoperative fluoroscopic assistance provided during lumbar spine surgery Electronically Signed   By: Donavan Foil M.D.   On: 02/26/2019 19:39    Assessment/Plan: Improving  LOS:  1 day  Ok to d/c to home per DrStern. Rx's for Oxycodone 5mg  and Robaxin 500mg  will be eRx'ed. She verbalizes understanding of d/c instructions. She has office f/u appt scheduled.   Verdis Prime 02/27/2019, 7:27 AM  Patient is doing well.

## 2019-02-27 NOTE — Evaluation (Signed)
Occupational Therapy Evaluation Patient Details Name: Jane Daniels MRN: [id number] DOB: 25-May-1946 Today's Date: 02/27/2019    History of Present Illness 73 yo female s/p L4-5 fusion. PMH including Arthritis, Cancer, CAD, Deafness in left ear, Depression, GERD, History of hiatal hernia, HTN, Hypothyroidism, and Stroke.   Clinical Impression   PTA, pt was living with her husband and was independent. Pt currently requiring Supervision-Min Guard A for safety during ADLs and functional mobility. Providing education and handout on back precautions, sleep positioning, grooming, LB ADLs, brace management, and safe functional transfers. Pt verbalized and demonstrated understanding. Answered all pt questions. Recommend dc home once medically stable per physician. All acute OT needs met and will sign off. Thank you.      Follow Up Recommendations  No OT follow up;Supervision/Assistance - 24 hour    Equipment Recommendations  3 in 1 bedside commode    Recommendations for Other Services PT consult     Precautions / Restrictions Precautions Precautions: Back Precaution Booklet Issued: Yes (comment) Precaution Comments: Reviewed back precautions and compensatory techniques for ADLs Required Braces or Orthoses: Spinal Brace Spinal Brace: Lumbar corset;Applied in sitting position      Mobility Bed Mobility               General bed mobility comments: Pt at EOB upon arrival.  Transfers Overall transfer level: Needs assistance   Transfers: Sit to/from Stand Sit to Stand: Min guard         General transfer comment: Min Guard A for safety    Balance Overall balance assessment: Mild deficits observed, not formally tested                                         ADL either performed or assessed with clinical judgement   ADL Overall ADL's : Needs assistance/impaired Eating/Feeding: Independent   Grooming: Supervision/safety;Standing Grooming Details (indicate  cue type and reason): Educated on compensatory techniques for oral care Upper Body Bathing: Supervision/ safety;Sitting   Lower Body Bathing: Min guard;Sit to/from stand   Upper Body Dressing : Supervision/safety;Sitting   Lower Body Dressing: Min guard;Sit to/from stand   Toilet Transfer: Min guard;Ambulation(simulated in room) Toilet Transfer Details (indicate cue type and reason): Discussed use of 3N1 over toilet as riser Toileting- Clothing Manipulation and Hygiene: Min guard;Sit to/from stand Toileting - Clothing Manipulation Details (indicate cue type and reason): Educated on compensatory techniques for toilet hygiene     Functional mobility during ADLs: Min guard General ADL Comments: Pt performing ADLs and functional mobility at [employer] A level. Providing pt with education on back precautions, sleep positioning, LB ADLs, brace management, grooming, and safe functional transfers.      Vision Baseline Vision/History: Wears glasses Wears Glasses: At all times Patient Visual Report: No change from baseline       Perception     Praxis      Pertinent Vitals/Pain Pain Assessment: Faces Faces Pain Scale: Hurts even more Pain Location: Back and Bil thighs Pain Descriptors / Indicators: Constant;Discomfort;Grimacing Pain Intervention(s): Monitored during session     Hand Dominance     Extremity/Trunk Assessment Upper Extremity Assessment Upper Extremity Assessment: Overall WFL for tasks assessed   Lower Extremity Assessment Lower Extremity Assessment: Defer to PT evaluation   Cervical / Trunk Assessment Cervical / Trunk Assessment: Other exceptions Cervical / Trunk Exceptions: s/p L4-L5 fusion   Communication Communication Communication: No difficulties  Cognition Arousal/Alertness: Awake/alert Behavior During Therapy: WFL for tasks assessed/performed Overall Cognitive Status: Within Functional Limits for tasks assessed                                      General Comments       Exercises     Shoulder Instructions      Home Living Family/patient expects to be discharged to:: Private residence Living Arrangements: Spouse/significant other Available Help at Discharge: Family;Available 24 hours/day Type of Home: House Home Access: Stairs to enter [employer] of Steps: 5 Entrance Stairs-Rails: Can reach both Home Layout: One level     Bathroom Shower/Tub: Tub/shower unit;Walk-in shower   Bathroom Toilet: Standard     Home Equipment: None          Prior Functioning/Environment Level of Independence: Independent        Comments: ADLs, IADLs, driving, and would babysit grandchildren        OT Problem List: Decreased range of motion;Decreased activity tolerance;Impaired balance (sitting and/or standing);Decreased knowledge of use of DME or AE;Decreased knowledge of precautions;Pain      OT Treatment/Interventions:      OT Goals(Current goals can be found in the care plan section) Acute Rehab OT Goals Patient Stated Goal: "Go home today" OT Goal Formulation: All assessment and education complete, DC therapy  OT Frequency:     Barriers to D/C:            Co-evaluation              AM-PAC OT "6 Clicks" Daily Activity     Outcome Measure Help from another person eating meals?: None Help from another person taking care of personal grooming?: None Help from another person toileting, which includes using toliet, bedpan, or urinal?: None Help from another person bathing (including washing, rinsing, drying)?: A Little Help from another person to put on and taking off regular upper body clothing?: None Help from another person to put on and taking off regular lower body clothing?: A Little 6 Click Score: 22   End of Session Equipment Utilized During Treatment: Back brace Nurse Communication: Mobility status  Activity Tolerance: Patient tolerated treatment well Patient left:  (With PT in hallway)  OT Visit Diagnosis: Unsteadiness on feet (R26.81);Other abnormalities of gait and mobility (R26.89);Muscle weakness (generalized) (M62.81);Pain Pain - part of body: (Back)                Time: 8676-7209 OT Time Calculation (min): 20 min Charges:  OT General Charges $OT Visit: 1 Visit OT Evaluation $OT Eval Low Complexity: [place], OTR/L Acute Rehab Pager: [phone] Office: [place] 02/27/2019, 8:33 AM

## 2019-02-27 NOTE — Discharge Summary (Signed)
Physician Discharge Summary  Patient ID: Jane Daniels MRN: [id number] DOB/AGE: Oct 31, 1946 73 y.o.  Admit date: 02/26/2019 Discharge date: 02/27/2019  Admission Diagnoses:Spondylolisthesis, Lumbar region, lumbar stenosis, facet arthropathy, radiculopathy, lumbago L 45 level    Discharge Diagnoses: Spondylolisthesis, Lumbar region, lumbar stenosis, facet arthropathy, radiculopathy, lumbago L 45 level s/p Lumbar four-five posterior lumbar interbody fusion - Lumbar four-five posterior lumbar interbody fusion with PEEK cages, autograft, pedicle screw fixation, posterolateral arthrodesis     Active Problems:   Spondylolisthesis of lumbar region   Discharged Condition: good  Hospital Course: Kymberli Wiegand was admitted for surgery with dx stenosis, spondylolisthesis, and radiculopathy. Following uncomplicated PLIF I2-9, she recovered nicely and transferred to [facility] for nursing care and therapies. She is mobilizing well.   Consults: None  Significant Diagnostic Studies: radiology: X-Ray: intra-op  Treatments: surgery: Lumbar four-five posterior lumbar interbody fusion - Lumbar four-five posterior lumbar interbody fusion with PEEK cages, autograft, pedicle screw fixation, posterolateral arthrodesis    Discharge Exam: Blood pressure 139/74, pulse 73, temperature 98.5 F (36.9 C), temperature source Oral, resp. rate 19, height 5\' 5"  (1.651 m), weight 86.2 kg, SpO2 98 %. Alert, conversant. Reports only lumbar soreness. Incision without erythema, swelling, or drainage beneath honeycomb and Dermabond. Good strength BLE.    Disposition:  Discharge to home. Rx's for Oxycodone 5mg  and Robaxin 500mg  will be eRx'ed. She verbalizes understanding of d/c instructions. She has office f/u appt scheduled.    Allergies as of 02/27/2019      Reactions   Hydromorphone Other (See Comments)   Pt states she stopped breathing     Amlodipine Besylate Nausea And Vomiting      Fentanyl  Other (See Comments)   Dizziness   Lisinopril Nausea And Vomiting      Pregabalin Other (See Comments)   Feel like my brain was fuzzy      Medication List    STOP taking these medications   naproxen sodium 220 MG tablet Commonly known as:  ALEVE     TAKE these medications   acetaminophen 500 MG tablet Commonly known as:  TYLENOL Take 500 mg by mouth every 6 (six) hours as needed for moderate pain.   albuterol (2.5 MG/3ML) 0.083% nebulizer solution Commonly known as:  PROVENTIL Inhale 2.5 mg into the lungs every 6 (six) hours as needed for wheezing.   Aspirin Low Dose 81 MG EC tablet Generic drug:  aspirin Take 81 mg by mouth daily.   Breo Ellipta 100-25 MCG/INH Aepb Generic drug:  fluticasone furoate-vilanterol Inhale 1 puff into the lungs daily.   DULoxetine 60 MG capsule Commonly known as:  CYMBALTA Take 60 mg by mouth every morning.   DULoxetine 30 MG capsule Commonly known as:  CYMBALTA Take 30 mg by mouth every morning.   fluticasone 50 MCG/ACT nasal spray Commonly known as:  FLONASE Place 2 sprays into both nostrils daily.   losartan 50 MG tablet Commonly known as:  COZAAR Take 50 mg by mouth daily.   methocarbamol 500 MG tablet Commonly known as:  ROBAXIN Take 1 tablet (500 mg total) by mouth every 6 (six) hours as needed for muscle spasms.   oxyCODONE 5 MG immediate release tablet Commonly known as:  Oxy IR/ROXICODONE Take 1 tablet (5 mg total) by mouth every 3 (three) hours as needed for moderate pain ((score 4 to 6)).   pantoprazole 40 MG tablet Commonly known as:  PROTONIX Take 40 mg by mouth daily.   Praluent 75 MG/ML Soaj Generic drug:  Alirocumab  Inject 75 mg into the skin every 14 (fourteen) days.   pyridOXINE 100 MG tablet Commonly known as:  VITAMIN B-6 Take 100 mg by mouth daily.   Synthroid 112 MCG tablet Generic drug:  levothyroxine Take 112 mcg by mouth daily before breakfast.   vitamin B-12 1000 MCG tablet Commonly known  as:  CYANOCOBALAMIN Take 1,000 mcg by mouth daily.   Vitamin D 50 MCG (2000 UT) Caps Take 2,000 Units by mouth daily.        Signed: Peggyann Shoals, MD 02/27/2019, 8:39 AM

## 2019-02-27 NOTE — Care Management CC44 (Signed)
Condition Code 44 Documentation Completed  Patient Details  Name: Jojo Geving MRN: [id number] Date of Birth: Feb 28, 1946   Condition Code 44 given:  Yes Patient signature on Condition Code 44 notice:  Yes Documentation of 2 MD's agreement:  Yes Code 44 added to claim:  Yes    Ninfa Meeker, RN 02/27/2019, 10:30 AM

## 2019-02-27 NOTE — Progress Notes (Signed)
Pt doing well. Pt given D/C instructions with verbal understanding. Pt's incision is clean and dry with no sign of infection. Pt's IV was removed prior to D/C. Pt D/C'd home via wheelchair per MD order. Pt is stable @ D/C and has no other needs at this time. Holli Humbles, RN

## 2019-02-27 NOTE — Evaluation (Signed)
Physical Therapy Evaluation Patient Details Name: Jane Daniels MRN: [id number] DOB: 1946-03-22 Today's Date: 02/27/2019   History of Present Illness  73 yo female s/p L4-5 fusion. PMH including Arthritis, Cancer, CAD, Deafness in left ear, Depression, GERD, History of hiatal hernia, HTN, Hypothyroidism, and Stroke.  Clinical Impression  Pt admitted with above diagnosis. Pt currently with functional limitations due to the deficits listed below (see PT Problem List). At the time of PT eval pt was able to perform transfers and ambulation with gross min guard assist for balance support and safety. Pt with guarded, unsteady gait and drifted L throughout gait training. Pt was cued for precautions, car transfer, stair negotiation, activity progression, and positioning recommendations. Pt will benefit from skilled PT to increase their independence and safety with mobility to allow discharge to the venue listed below.       Follow Up Recommendations No PT follow up;Supervision - Intermittent    Equipment Recommendations  None recommended by PT    Recommendations for Other Services       Precautions / Restrictions Precautions Precautions: Back Precaution Booklet Issued: Yes (comment) Precaution Comments: Reviewed handout with pt and she was cued for maintenance of precautions during functional mobility Required Braces or Orthoses: Spinal Brace Spinal Brace: Lumbar corset;Applied in sitting position Restrictions Weight Bearing Restrictions: No      Mobility  Bed Mobility               General bed mobility comments: Pt standing in room with OT when PT arrived.   Transfers Overall transfer level: Needs assistance Equipment used: None Transfers: Sit to/from Stand Sit to Stand: Min guard         General transfer comment: Close guard for safety with stand>sit. VC's for hand placement on seated surface for safety.   Ambulation/Gait Ambulation/Gait assistance: Min guard Gait  Distance (Feet): 250 Feet Assistive device: None Gait Pattern/deviations: Step-through pattern;Decreased stride length;Drifts right/left Gait velocity: Decreased Gait velocity interpretation: 1.31 - 2.62 ft/sec, indicative of limited community ambulator General Gait Details: Guarded and drifting to the L throughout gait training.   Stairs Stairs: Yes Stairs assistance: Min guard Stair Management: One rail Right;Step to pattern;Alternating pattern;Forwards Number of Stairs: 6 General stair comments: VC's for sequencing and general safety. Pt alternating steps upon ascend but step-to on descend. Recommend step-to for both ascending and descending for safety.   Wheelchair Mobility    Modified Rankin (Stroke Patients Only)       Balance Overall balance assessment: Mild deficits observed, not formally tested                                           Pertinent Vitals/Pain Pain Assessment: Faces Faces Pain Scale: Hurts even more Pain Location: Back and Bil thighs Pain Descriptors / Indicators: Constant;Discomfort;Grimacing Pain Intervention(s): Monitored during session;Repositioned    Home Living Family/patient expects to be discharged to:: Private residence Living Arrangements: Spouse/significant other Available Help at Discharge: Family;Available 24 hours/day Type of Home: House Home Access: Stairs to enter Entrance Stairs-Rails: Can reach both Entrance Stairs-Number of Steps: 5 Home Layout: One level Home Equipment: None      Prior Function Level of Independence: Independent         Comments: ADLs, IADLs, driving, and would babysit grandchildren     Hand Dominance        Extremity/Trunk Assessment   Upper Extremity Assessment  Upper Extremity Assessment: Defer to OT evaluation    Lower Extremity Assessment Lower Extremity Assessment: Generalized weakness(Consistent with pre-op diagnosis)    Cervical / Trunk Assessment Cervical / Trunk  Assessment: Other exceptions Cervical / Trunk Exceptions: s/p L4-L5 fusion  Communication   Communication: No difficulties  Cognition Arousal/Alertness: Awake/alert Behavior During Therapy: WFL for tasks assessed/performed Overall Cognitive Status: Within Functional Limits for tasks assessed                                        General Comments      Exercises     Assessment/Plan    PT Assessment Patient needs continued PT services  PT Problem List Decreased strength;Decreased activity tolerance;Decreased balance;Decreased mobility;Decreased knowledge of use of DME;Decreased safety awareness;Decreased knowledge of precautions;Pain       PT Treatment Interventions DME instruction;Gait training;Stair training;Functional mobility training;Therapeutic activities;Therapeutic exercise;Neuromuscular re-education;Patient/family education    PT Goals (Current goals can be found in the Care Plan section)  Acute Rehab PT Goals Patient Stated Goal: "Go home today" PT Goal Formulation: With patient Time For Goal Achievement: 03/06/19 Potential to Achieve Goals: Good    Frequency Min 5X/week   Barriers to discharge        Co-evaluation               AM-PAC PT "6 Clicks" Mobility  Outcome Measure Help needed turning from your back to your side while in a flat bed without using bedrails?: None Help needed moving from lying on your back to sitting on the side of a flat bed without using bedrails?: None Help needed moving to and from a bed to a chair (including a wheelchair)?: A Little Help needed standing up from a chair using your arms (e.g., wheelchair or bedside chair)?: A Little Help needed to walk in hospital room?: A Little Help needed climbing 3-5 steps with a railing? : A Little 6 Click Score: 20    End of Session Equipment Utilized During Treatment: Gait belt;Back brace Activity Tolerance: Patient tolerated treatment well Patient left: in chair;with  call bell/phone within reach Nurse Communication: Mobility status PT Visit Diagnosis: Unsteadiness on feet (R26.81);Pain;Difficulty in walking, not elsewhere classified (R26.2) Pain - Right/Left: (bilateral) Pain - part of body: Hip(back)    Time: 2423-5361 PT Time Calculation (min) (ACUTE ONLY): 22 min   Charges:   PT Evaluation $PT Eval Moderate Complexity: 1 Mod          Rolinda Roan, PT, DPT Acute Rehabilitation Services Pager: [phone] Office: [phone]   Thelma Comp 02/27/2019, 9:14 AM

## 2019-03-02 LAB — TYPE AND SCREEN
ABO/RH(D): O POS
ANTIBODY SCREEN: POSITIVE
Donor AG Type: NEGATIVE
Donor AG Type: NEGATIVE
PT AG Type: NEGATIVE
Unit division: 0
Unit division: 0

## 2019-03-02 LAB — BPAM RBC
Blood Product Expiration Date: 202004012359
Blood Product Expiration Date: 202004012359
Unit Type and Rh: 5100
Unit Type and Rh: 5100

## 2019-06-27 ENCOUNTER — Other Ambulatory Visit: Payer: Self-pay

## 2019-06-27 ENCOUNTER — Encounter: Payer: Self-pay | Admitting: Neurology

## 2019-06-27 ENCOUNTER — Ambulatory Visit (INDEPENDENT_AMBULATORY_CARE_PROVIDER_SITE_OTHER): Payer: Medicare Other | Admitting: Neurology

## 2019-06-27 VITALS — BP 140/93 | HR 74 | Temp 98.4°F | Ht 65.0 in | Wt 190.0 lb

## 2019-06-27 DIAGNOSIS — R413 Other amnesia: Secondary | ICD-10-CM | POA: Diagnosis not present

## 2019-06-27 DIAGNOSIS — G3183 Dementia with Lewy bodies: Secondary | ICD-10-CM | POA: Diagnosis not present

## 2019-06-27 DIAGNOSIS — G459 Transient cerebral ischemic attack, unspecified: Secondary | ICD-10-CM | POA: Diagnosis not present

## 2019-06-27 DIAGNOSIS — G20 Parkinson's disease: Secondary | ICD-10-CM

## 2019-06-27 DIAGNOSIS — I6381 Other cerebral infarction due to occlusion or stenosis of small artery: Secondary | ICD-10-CM

## 2019-06-27 NOTE — Patient Instructions (Signed)
DAt Scan (Dopamine transport scan) for parkinson's disorders (includes lewy body) FDG PEt Scan for alzheimers and frontotemporal dementia Review all records and images Repeat formal memory testing (Dr. Sima Matas)   Lewy Body Dementia Lewy body dementia, also called dementia with Lewy bodies, is a condition that affects the way the brain functions. It is one form of dementia. In this condition, proteins called Lewy bodies build up in certain areas of the brain. This causes problems with:  Memory.  Decision making.  Behavior.  Speaking.  Thinking.  Movements and balance.  Problem solving. This condition is progressive, which means that it gets worse with time (is degenerative) and cannot be reversed. What are the causes? This condition is caused by the buildup of Lewy bodies in brain cells in areas of the brain that control memory, thinking, and movement. It is not known what causes the Lewy bodies to build up. What increases the risk? You are more likely to develop this condition if you:  Have a family history of Lewy body dementia or Parkinson's disease.  Are 44 years old or older.  Are female. What are the signs or symptoms? Symptoms of this condition may include:  Symptoms of dementia, such as: ? Trouble with memory. ? Trouble paying attention. ? Problems with planning and organizing. ? Problems with judgment. ? Behavioral problems.  Symptoms of Parkinson's disease, such as: ? Shaking movements that you cannot control (tremor). Tremors usually start in a hand or foot when you are resting (resting tremor). ? Stooped posture. ? Slowing of movement. ? Stiff muscles (rigidity). ? Loss of balance and stability when standing.  Seeing things that are not there (hallucinating).  Changes in memory, attention, and concentration that come and go (fluctuation).  Sleep problems, such as acting out dreams while you are asleep. How is this diagnosed? This condition is  diagnosed by a specialist who diagnoses and treats this condition (neurologist). Your health care provider will talk with you and your family, friends, or caregivers about your history and symptoms. A thorough medical history will be taken, and you will have a physical exam and tests. Tests may include:  Lab tests, such as blood or urine tests.  Imaging tests, such as a CT scan, a PET scan, or an MRI.  A test that involves removing and testing a small amount of the fluid that surrounds the brain and spinal cord (lumbar puncture).  A test where small metal discs are used to measure electrical activity in the brain (electroencephalogram or EEG).  Tests that evaluate brain function, such as memory tests, cognitive tests, and neuropsychological tests. How is this treated? There is no cure for this condition. Treatment focuses on managing your symptoms. Treatment may include:  Medicines. Everyone responds to medicines differently. Your response may change over time. Work with your health care provider to find the best medicines for you.  Speech, occupational, and physical therapy. Your health care provider can help direct you to support groups, organizations, and other health care providers who can help with decisions about your care. Follow these instructions at home: Medicine  Take over-the-counter and prescription medicines only as told by your health care provider.  To help you manage your medicines, use a pill organizer or pill reminder.  Avoid taking medicines that can affect thinking, such as pain medicines or sleeping medicines. Lifestyle  Make healthy lifestyle choices: ? Be physically active as told by your health care provider. ? Do not use any products that contain nicotine or  tobacco, such as cigarettes, e-cigarettes, and chewing tobacco. If you need help quitting, ask your health care provider. ? Try to practice stress-management techniques when you experience stress, such as  mindfulness, yoga, or deep breathing. ? Stay socially connected. Talk regularly with other people, such as family, friends, and neighbors.  Make sure you sleep well. These tips can help you get a good night's rest: ? Avoid napping during the day. ? Keep your sleeping area dark and cool. ? Avoid exercising a few hours before you go to bed. ? Avoid caffeine products in the evening. Eating and drinking  Do not drink alcohol.  Drink enough fluid to keep your urine pale yellow.  Eat a healthy diet. Safety      Work with your health care provider to determine what you need help with and what your safety needs are.  If you have trouble moving around, use a cane or walker as told by your health care provider.  Make sure your home environment is safe. To do this: ? Remove things that can be a tripping hazard, such as throw rugs or clutter. ? Install grab bars and railings in your home to prevent falls.  Talk with your health care provider about if it is safe for you to drive.  If you were given a bracelet that identifies you as a person with memory loss or tracks your location, make sure to wear it at all times. General instructions  Work with your family to make important decisions, such as advance directives, medical power of attorney, or a living will.  Keep all follow-up visits as told by your health care provider. This is important. Contact a health care provider if you have:  A fever.  Problems with choking or swallowing.  Any symptoms of a new or different illness.  New or worsening trouble with sleeping or increased daytime sleepiness.  New or worsening confusion. Get help right away if:  You feel depressed, sad, or feel that you want to harm yourself.  Your family members become concerned for your safety. If you ever feel like you may hurt yourself or others, or have thoughts about taking your own life, get help right away. You can go to your nearest emergency  department or call:  Your local emergency services (911 in the [place]  A suicide crisis helpline, such as the [place] at 9498170561. This is open 24 hours a day. Summary  Dementia is a condition that affects the way the brain functions. It often affects memory and thinking.  Lewy body dementia is a degenerative dementia.  This condition is caused by the buildup of proteins called Lewy bodies in brain cells. It is not known what causes the Lewy bodies to build up.  Work with your health care provider to determine what you need help with and what your safety needs are.  Your health care provider can help direct you to support groups, organizations, and other health care providers who can help with decisions about your care. This information is not intended to replace advice given to you by your health care provider. Make sure you discuss any questions you have with your health care provider. Document Released: 08/27/2002 Document Revised: 01/31/2019 Document Reviewed: 01/31/2019 Elsevier Patient Education  [place].  Alzheimer's Disease Alzheimer's disease is a brain disease that affects memory, thinking, language, and behavior. People with Alzheimer's disease lose mental abilities, and the disease gets worse over time. Alzheimer's disease is a form  of dementia. What are the causes? This condition develops when a protein called beta-amyloid forms deposits in the brain. It is not known what causes these deposits to form. Alzheimer's disease may also be caused by a gene mutation that is inherited from one parent or both parents. A gene mutation is a harmful change in a gene. Not everyone who inherits the genetic mutation will get the disease. What increases the risk? You are more likely to develop this condition if you:  Are older than age 46.  Have a family history of dementia.  Have had a brain injury.  Have heart or blood vessel disease.   Have had a stroke.  Have high blood pressure or high cholesterol.  Have diabetes. What are the signs or symptoms? Symptoms of this condition may happen in three stages, which often overlap. Early stage In this stage, you may continue to be independent. You may still be able to drive, work, and be social. Symptoms in this stage include:  Minor memory problems, such as forgetting a name or what you read.  Difficulty with: ? Paying attention. ? Communicating. ? Doing familiar tasks. ? Problem solving or doing calculations. ? Following instructions. ? Learning new things.  Anxiety.  Social withdrawal.  Loss of motivation. Moderate stage In this stage, you will start to need care. Symptoms in this stage include:  Difficulty with expressing thoughts.  Memory loss that affects daily life. This can include forgetting: ? Your address or phone number. ? Recent events that have happened. ? Parts of your personal history, such as where you went to school.  Confusion about where you are or what time it is.  Difficulty in judging distance.  Changes in personality, mood, and behavior. You may be moody, irritable, angry, frustrated, fearful, anxious, or suspicious.  Poor reasoning and judgment.  Delusions or hallucinations.  Changes in sleep patterns.  Wandering and getting lost, even in familiar places. Severe stage In the final stage, you will need help with your personal care and daily activities. Symptoms in this stage include:  Worsening memory loss.  Personality changes.  Loss of awareness of your surroundings.  Changes in physical abilities, including the ability to walk, sit, and swallow.  Difficulty in communicating.  Inability to control your bladder and bowels.  Increasing confusion.  Increasing behavior changes. How is this diagnosed? This condition is diagnosed by a health care provider who specializes in diseases of the nervous system (neurologist).  Other causes of dementia may also be ruled out. Your health care provider will talk with you and your family, friends, or caregivers about your history and symptoms. A thorough medical history will be taken, and you will have a physical exam and tests. Tests may include:  Lab tests, such as blood or urine tests.  Imaging tests, such as a CT scan, a PET scan, or an MRI.  A lumbar puncture. This test involves removing and testing a small amount of the fluid that surrounds the brain and spinal cord.  An electroencephalogram (EEG). In this test, small metal discs are used to measure electrical activity in the brain.  Memory tests, cognitive tests, and neuropsychological tests. These tests evaluate brain function.  Genetic testing may be done if you have early onset of the disease (before age 42) or if other family members have the disease. How is this treated? At this time, there is no treatment to cure Alzheimer's disease or stop it from getting worse. The goals of treatment are:  To slow down symptoms of the disease, if possible.  To manage behavioral changes.  To provide you with a safe environment.  To help manage daily life for you and your caregivers. The following treatment options are available:  Medicines. Medicines may help to slow down memory loss and manage behavioral symptoms.  Cognitive therapy. Cognitive therapy provides you with education, support, and memory aids. It is most helpful in the early stages of the condition.  Counseling or spiritual guidance. It is normal to have a lot of feelings, including anger, relief, fear, and isolation. Counseling and guidance can help you deal with these feelings.  Caregiving. This involves having caregivers help you with your daily activities.  Family support groups. These provide education, emotional support, and information about community resources to family members who are taking care of you. Follow these instructions at home:   Medicines  Take over-the-counter and prescription medicines only as told by your health care provider.  Use a pill organizer or pill reminder to help you manage your medicines.  Avoid taking medicines that can affect thinking, such as pain medicines or sleeping medicines. Lifestyle  Make healthy lifestyle choices: ? Be physically active as told by your health care provider. Regular exercise may help improve symptoms. ? Do not use any products that contain nicotine or tobacco, such as cigarettes, e-cigarettes, and chewing tobacco. If you need help quitting, ask your health care provider. ? Do not drink alcohol. ? Eat a healthy diet. ? Practice stress-management techniques when you get stressed. ? Stay social.  Drink enough fluid to keep your urine pale yellow.  Make sure to get quality sleep. ? Avoid taking long naps during the day. Take short naps of 30 minutes or less if needed. ? Keep your sleeping area dark and cool. ? Avoid exercising during the few hours before you go to bed. ? Avoid caffeine products in the afternoon and evening. General instructions  Work with your health care provider to determine what you need help with and what your safety needs are.  If you were given a bracelet that identifies you as a person with memory loss or tracks your location, make sure to wear it at all times.  Talk with your health care provider about whether it is safe for you to drive.  Work with your family to make important decisions, such as advance directives, medical power of attorney, or a living will.  Keep all follow-up visits as told by your health care provider. This is important. Where to find more information  The Alzheimer's [employer]: Call the 24-hour helpline at 1-718-144-2404, or visit [employer] Contact a health care provider if:  You have nausea, vomiting, or trouble with eating.  You have dizziness or weakness.  You or your family members become concerned for  your safety. Get help right away if:  You feel depressed or sad, or feel that you want to harm yourself.  You develop chest pain or difficulty with breathing.  You pass out. If you ever feel like you may hurt yourself or others, or have thoughts about taking your own life, get help right away. You can go to your nearest emergency department or call:  Your local emergency services (911 in the [place]  A suicide crisis helpline, such as the [facility] at 513-382-1972. This is open 24 hours a day. Summary  Alzheimer's disease is a brain disease that affects memory, thinking, language, and behavior. Alzheimer's disease is a form of dementia.  This condition is diagnosed by a specialist in diseases of the nervous system (neurologist).  At this time, there is no treatment to cure Alzheimer's disease or stop it from getting worse. The goals of treatment are to slow memory loss and help you manage any symptoms.  Work with your family to make important decisions, such as advance directives, medical power of attorney, or a living will. This information is not intended to replace advice given to you by your health care provider. Make sure you discuss any questions you have with your health care provider. Document Released: 08/16/2004 Document Revised: 11/13/2018 Document Reviewed: 11/13/2018 Elsevier Patient Education  2020 [place] Dementia  Frontotemporal dementia (FTD) is a rare, progressive brain disorder that causes memory loss. FTD describes a range of diseases that often start with changes in behavior, speech, and thinking. As FTD progresses, it affects short-term memory. Over time, FTD causes the frontal and temporal anterior lobes of the brain to shrink. These are the parts of the brain that control behavior and speech. There are three main types of FTD:  Behavioral variant FTD. This is the most common type and was previously called Pick  disease.  Progressive agrammatic aphasia.  Semantic variant primary progressive aphasia. FTD is one of the most common causes of progressive memory loss in people younger than 60 years. The disease progresses faster in some people than in others. In some families, FTD can be associated with amyotrophic lateral sclerosis (ALS). There is no cure for FTD, but treatment and supportive care can improve a person's quality of life. What are the causes? The exact cause of this condition is not known, although many genetic mutations are known to cause the disease. What increases the risk? This condition is more likely to occur in people who have a family history of FTD. Family members of people with FTD should think about genetic counseling. What are the signs or symptoms? Signs and symptoms of FTD usually start between the ages of 7-65 years. Symptoms may include:  Impulsive, poorly controlled, inappropriate, or embarrassing behavior.  Lack of motivation and interest (apathy).  Irritability and agitation.  Neglect of personal hygiene.  Withdrawal.  Inappropriate crying or laughing (pseudobulbar affect).  Repetitive behaviors.  Impulsive eating  Lack of concern for others.  Failure to recognize behavioral changes.  Loss of the ability to speak fluently.  Inability to write or read.  Slurred speech.  Loss of vocabulary.  New drug or alcohol abuse. Short-term memory loss is also a symptom later in the disease. How is this diagnosed? Your health care provider may suspect FTD if you have worsening behavior or speech difficulties. You may need to see specialists in brain and behavioral health who will collect your medical history and do a neurological examination. The following tests may be done:  Blood tests to rule out other causes, like vitamin deficiency, harmful effects of substances (toxicities), and infections.  Spinal tap (lumbar puncture) to check spinal fluid samples for  abnormal proteins.  Imaging tests, such as a CT scan, PET scan, or MRI. These can show brain changes that suggest FTD or another brain disorder.  Memory testing (neuropsychological testing), which involves several hours of standardized tests to check the many functions of the brain. How is this treated? There is no cure for this condition and the progression of FTD cannot be stopped. Support at home is the most important aspect of managing FTD. Ask about caregiving resources in your community. Management of this  condition may include:  Antidepressants to help with apathy.  Medicines to treat pseudobulbar affect.  Sedative medicines to control aggressive or dangerous behavior.  Speech and language therapy.  Behavioral therapy.  Occupational therapy to help with home safety and activity. Institutional or supportive care may eventually be needed. Follow these instructions at home: Eating and drinking  Follow a healthy diet. Eat foods that are high in fiber, such as fresh fruits and vegetables, whole grains, and beans.  Avoid having too much sugar and caffeine in your diet.  Watch for signs of compulsive eating, which can lead to other health problems.  Do not drink alcohol.  Drink enough fluid to keep your urine pale yellow. Lifestyle  Keep a regular routine.  Avoid stress and new situations.  Avoid any activities that may trigger aggressive behavior.  Schedule regular, enjoyable, and supervised physical activity. General instructions  Take over-the-counter and prescription medicines only as told by your health care provider.  If you were given a bracelet that tracks your location, make sure you wear it.  Work with your health care provider to determine what you need help with and what your safety needs are.  Keep all follow-up visits, including any therapy visits, as told by your health care provider. This is important. Contact a health care provider if:  Your  symptoms change or get worse.  It becomes more difficult or stressful to be cared for at home. Get help right away if:  It is no longer possible to be cared for at home.  You or your family members become concerned for your safety. Summary  Frontotemporal dementia (FTD) is a rare, progressive brain disorder that causes memory loss.  There is no cure for this condition and the progression of FTD cannot be stopped.  Support at home is the most important aspect of managing FTD. Ask about caregiving resources in your community.  Work with your health care provider to determine what you need help with and what your safety needs are. This information is not intended to replace advice given to you by your health care provider. Make sure you discuss any questions you have with your health care provider. Document Released: 11/25/2002 Document Revised: 03/28/2019 Document Reviewed: 02/28/2017 Elsevier Patient Education  [place].

## 2019-06-27 NOTE — Progress Notes (Signed)
MMSE - Mini Mental State Exam 06/27/2019  Orientation to time 5  Orientation to Place 4  Registration 3  Attention/ Calculation 5  Recall 3  Language- name 2 objects 2  Language- repeat 1  Language- follow 3 step command 3  Language- read & follow direction 1  Write a sentence 1  Copy design 1  Total score 29

## 2019-06-27 NOTE — Progress Notes (Signed)
[facility]    Provider:  Dr Jaynee Eagles Requesting Provider: Erline Levine, MD Primary Care Provider:  Kristopher Glee., MD  CC:  Memory loss    HPI 06/27/2019:  Jane Daniels is a 73 y.o. female here as requested by Dr. Vertell Limber for abnormalities of gait and mobility. She has PMHx of thyroid diseas, HTN, stroke, depression, deafness in left ear, CAD, arthritis, spondylolisthesis/lumbar stenosis and radiculopathy of lumbar region s/p surgery. She is worried about her memory, her sister died of Lewy Body Dementia and it was a very difficult time watching her sister battle this awful disorder. She noticed memory changes since her surgery and maybe a year ago, her memory is very bad. Her grandchildren will help correct what she is trying to say. The first day she noticed, she laid down and she became very dizzy and felt something wrong, she went out to talk to everyone and she couldn't put words together, her speech was garbled, [place], she was there about a month ago. She has short-term memory loss. Progressively worsening.  Her sister had dementia, lewy body dementia, she died 2 years later at the age of 57.Mother may have had dementia she was 22 when she died. She can keep up with the finances, she has everything automated, she does all the bills, she still cooks and it is just the 2 of them and she cooks light meals and orders pizza, doesn't get confused but she has forgotten to turn the stove off.  Husband notices, he has been telling her to get something done, her gait is slow, she used to keep up but not she can't. She feels like she is shuffling and her husband tells her she shuffles, she was diagnosed with essential tremor no resting tremor.  She has good smell, good taste, handwriting is ok. She has depression and she is on medication but may not be treated well. She has had b12 testing. Traumatized by her sisters lewy body dementia.   Reviewed notes, labs and  imaging from outside physicians, which showed: Patient is here as a referral by Dr. Vertell Limber status post lumbar surgery.  She reported balance problems and memory problems.  Surgery for spondylolisthesis in the lumbar region, lumbar stenosis, facet arthropathy, radiculopathy and lumbago L4-L5 levels.  He also reports that she complains of hip pain as well and wonders if this is part of her disorder.  I reviewed MRI hip left without contrast report which showed no hip fracture, dislocation or avascular necrosis.  Mild tendinosis of the left hamstring origin.  She also has a history of high primary hypertension and BMI 33 obese.  Patient has had falls and the falls have resulted in injury with head concussion.  I reviewed exam which was basically unremarkable,.  Since her surgery she has not fallen.  She had residual weakness which appeared to be 5 out of 5 last time checked.  Reviewed Dr. Melven Sartorius discharge summary with discharge diagnosis of Spondylolisthesis, Lumbar region, lumbar stenosis, facet arthropathy, radiculopathy, lumbago L 45 levels/pLumbar four-five posterior lumbar interbody fusion - Lumbar four-five posterior lumbar interbody fusion with PEEK cages, autograft, pedicle screw fixation, posterolateral arthrodesis.  Reviewed MRI of the lumbar spine 01/05/2019 and agree with the following: IMPRESSION: 1. 5 mm anterolisthesis of L4 on L5 with associated disc bulge and advanced facet hypertrophy, resulting in mild bilateral L4 foraminal stenosis. 2. Advanced facet hypertrophy with mild disc bulging at L5-S1 without significant stenosis. 3. Underlying mild  dextroscoliosis.  Need all records, imaging, dr Corie Chiquito notes and labs. Will request.  Review[facility] from [facility]  CTA head and neck revealed atherosclerosis but patent carotid arteries. MRI of the brain with white matter changes and old lacunar infarcts in right basal ganglia and left thalamus.  Review of Systems: Patient complains of  symptoms per HPI as well as the following symptoms: memory loss, slowed gait. Pertinent negatives and positives per HPI. All others negative.   Social History   Socioeconomic History   Marital status: Married    Spouse name: Not on file   Number of children: 2   Years of education: Not on file   Highest education level: High school graduate  Occupational History   Not on file  Social Needs   Financial resource strain: Not on file   Food insecurity    Worry: Not on file    Inability: Not on file   Transportation needs    Medical: Not on file    Non-medical: Not on file  Tobacco Use   Smoking status: Former Smoker    Years: 10.00    Types: Cigarettes    Quit date: 1972    Years since quitting: 48.5   Smokeless tobacco: Never Used  Substance and Sexual Activity   Alcohol use: Never    Frequency: Never   Drug use: Never   Sexual activity: Not on file  Lifestyle   Physical activity    Days per week: Not on file    Minutes per session: Not on file   Stress: Not on file  Relationships   Social connections    Talks on phone: Not on file    Gets together: Not on file    Attends religious service: Not on file    Active member of club or organization: Not on file    Attends meetings of clubs or organizations: Not on file    Relationship status: Not on file   Intimate partner violence    Fear of current or ex partner: Not on file    Emotionally abused: Not on file    Physically abused: Not on file    Forced sexual activity: Not on file  Other Topics Concern   Not on file  Social History Narrative   Lives at home with her husband and puppy   Right handed    Family History  Problem Relation Age of Onset   Stroke Mother    Other Mother        balance problems   Heart attack Father    Stroke Sister    Heart attack Sister    Breast cancer Sister        metastatic     Past Medical History:  Diagnosis Date   Arthritis    Cancer ([place])     squamous cell carcinoma   Coronary artery disease    Deafness in left ear    Depression    GERD (gastroesophageal reflux disease)    History of hiatal hernia    Hypertension    Hypothyroidism    thyroid ablation 20 years ago   Stroke [facility])    2018-"2 minor strokes"    Patient Active Problem List   Diagnosis Date Noted   Spondylolisthesis of lumbar region 02/26/2019    Past Surgical History:  Procedure Laterality Date   ANKLE SURGERY Right    ANKLE SURGERY Left    APPENDECTOMY     CARDIAC CATHETERIZATION     stents  x2; 20+ years ago   CATARACT EXTRACTION Left 2020   pending R removal   CESAREAN SECTION     x2   CHOLECYSTECTOMY     thyroid ablation     TONSILLECTOMY     TRANSFORAMINAL LUMBAR INTERBODY FUSION (TLIF) WITH PEDICLE SCREW FIXATION 1 LEVEL Left 02/26/2019   Procedure: Lumbar four-five posterior lumbar interbody fusion;  Surgeon: Erline Levine, MD;  Location: [place];  Service: Neurosurgery;  Laterality: Left;  Lumbar four-five posterior lumbar interbody fusion   [facility] in [place], [place] Left    Done in [place], [place]    Current Outpatient Medications  Medication Sig Dispense Refill   acetaminophen (TYLENOL) 500 MG tablet Take 500 mg by mouth every 6 (six) hours as needed for moderate pain.     ASPIRIN LOW DOSE 81 MG EC tablet Take 325 mg by mouth daily.      BREO ELLIPTA 100-25 MCG/INH AEPB Inhale 1 puff into the lungs daily.     Cholecalciferol (VITAMIN D-3 PO) Take 50 mcg by mouth daily.     clopidogrel (PLAVIX) 75 MG tablet Take 75 mg by mouth daily.     DULoxetine (CYMBALTA) 30 MG capsule Take 30 mg by mouth every morning.     DULoxetine (CYMBALTA) 60 MG capsule Take 60 mg by mouth every morning.     Ferrous Sulfate (IRON PO) Take 65 mg by mouth daily.     fluticasone (FLONASE) 50 MCG/ACT nasal spray Place 2 sprays into both nostrils daily.     hydrochlorothiazide (MICROZIDE) 12.5 MG capsule  Take 25 mg by mouth daily.     losartan (COZAAR) 50 MG tablet Take 50 mg by mouth daily.     pantoprazole (PROTONIX) 40 MG tablet Take 40 mg by mouth daily.     PRALUENT 75 MG/ML SOAJ Inject 75 mg into the skin every 14 (fourteen) days.     pyridOXINE (VITAMIN B-6) 100 MG tablet Take 100 mg by mouth daily.     SYNTHROID 112 MCG tablet Take 112 mcg by mouth daily before breakfast.     vitamin B-12 (CYANOCOBALAMIN) 1000 MCG tablet Take 1,000 mcg by mouth daily.     albuterol (PROVENTIL) (2.5 MG/3ML) 0.083% nebulizer solution Inhale 2.5 mg into the lungs every 6 (six) hours as needed for wheezing.     No current facility-administered medications for this visit.     Allergies as of 06/27/2019 - Review Complete 06/27/2019  Allergen Reaction Noted   Hydromorphone Other (See Comments) 07/24/2014   Amlodipine besylate Nausea And Vomiting 07/24/2014   Fentanyl Other (See Comments) 12/19/2018   Lisinopril Nausea And Vomiting 07/24/2014   Pregabalin Other (See Comments) 05/27/2016    Vitals: BP (!) 140/93 (BP Location: Left Arm, Patient Position: Sitting)    Pulse 74    Temp 98.4 F (36.9 C)    Ht 5\' 5"  (1.651 m)    Wt 190 lb (86.2 kg)    BMI 31.62 kg/m  Last Weight:  Wt Readings from Last 1 Encounters:  06/27/19 190 lb (86.2 kg)   Last Height:   Ht Readings from Last 1 Encounters:  06/27/19 5\' 5"  (1.651 m)    Physical exam: Exam: Gen: NAD, conversant, well nourised, well groomed                     CV: RRR, no MRG. No Carotid Bruits. No peripheral edema, warm, nontender Eyes: Conjunctivae clear without  exudates or hemorrhage  Neuro: Detailed Neurologic Exam  Speech:    Speech is normal; fluent and spontaneous with normal comprehension.  Cognition:  MMSE - Mini Mental State Exam 06/27/2019  Orientation to time 5  Orientation to Place 4  Registration 3  Attention/ Calculation 5  Recall 3  Language- name 2 objects 2  Language- repeat 1  Language- follow 3 step  command 3  Language- read & follow direction 1  Write a sentence 1  Copy design 1  Total score 29       The patient is oriented to person, place, and time;     recent and remote memory intact;     language fluent;     normal attention, concentration,     fund of knowledge Cranial Nerves:    The pupils are equal, round, and reactive to light. The fundi are normal and spontaneous venous pulsations are present. Visual fields are full to finger confrontation. Extraocular movements are intact. Trigeminal sensation is intact and the muscles of mastication are normal. The face is symmetric. The palate elevates in the midline. Hearing intact. Voice is normal. Shoulder shrug is normal. The tongue has normal motion without fasciculations.   Coordination:    No dysmetria  Gait:Mildly low clearance and slight stoop at the waist may be due to low back pain, good turn, good arm swing.      Motor Observation:    No asymmetry, no atrophy, and no involuntary movements noted. Tone:    Normal muscle tone.    Posture:    Posture is normal. normal erect    Strength:    Strength is V/V in the upper and lower limbs.      Sensation: intact to LT     Reflex Exam:  DTR's:    Absent AJs otherwise deep tendon reflexes in the upper and lower extremities are 2+ bilaterally.   Toes:    The toes are downgoing bilaterally.   Clonus:    Clonus is absent.    Assessment/Plan:   73 y.o. female here as requested by Dr. Vertell Limber for abnormalities of gait and mobility. She has PMHx of thyroid diseas, HTN, stroke, depression, deafness in left ear, CAD, arthritis, spondylolisthesis/lumbar stenosis and radiculopathy of lumbar region s/p surgery. She is worried most about her memory today, her sister died of Lewy Body Dementia and it was a very difficult time watching her sister battle this awful disorder and I suspect this has deeply affected her and she worries about her own memory loss and gait disorder. Mother also  had dementia.  I suspect her neuropsychiatric testing will be unremarkable but given complaints of short-term memory loss will order an FDG PET Scan for dementia as well as DAT Scan to evaluate for Parkinson's spectrum disorder given her physical exam but I suspect her gait is due to her severe lumbar degenerative disease and spinal stenosis improved after surgery with Dr. Vertell Limber.   - Formal neurocognitive testing - DAT scan - FDG PET Scan  - Will request labs from PCP, need to see if she has had B12, folate, Vit B1, tsh, homocysteine, MMA and, if not, order - asked her to discuss with PCP -CTA head and neck revealed atherosclerosis but patent carotid arteries. MRI of the brain with white matter changes and old lacunar infarcts in right basal ganglia and left thalamus. I had a long d/w patient about her recent stroke, risk for recurrent stroke/TIAs, personally independently reviewed imaging studies and stroke evaluation  results and answered questions.Continue ASA and Plavix for secondary stroke prevention, change to Plavix alone due to risk of bleeding in 3 weeks(discussed). Maintain strict control of hypertension with blood pressure goal below 130/90, diabetes with hemoglobin A1c goal below 6.5% and lipids with LDL cholesterol goal below 70 mg/dL. I advised patient follow closely with pcp for vascular risk factors. I also advised the patient to eat a healthy diet with plenty of whole grains, cereals, fruits and vegetables, exercise regularly and maintain ideal body weight. Followup in the future after workup above complete.    Orders Placed This Encounter  Procedures   NM PET Metabolic Brain   NM BRAIN DATSCAN TUMOR LOC INFLAM SPECT 1 DAY   Ambulatory referral to Neuropsychology    Cc: Kristopher Glee., MD,  Erline Levine, MD  Sarina Ill, MD  [facility] [address] [place], [place] [address]  Phone [phone] Fax [phone]

## 2019-06-30 ENCOUNTER — Encounter: Payer: Self-pay | Admitting: Neurology

## 2019-07-01 ENCOUNTER — Telehealth: Payer: Self-pay | Admitting: *Deleted

## 2019-07-01 NOTE — Telephone Encounter (Signed)
error 

## 2019-07-09 ENCOUNTER — Telehealth: Payer: Self-pay | Admitting: *Deleted

## 2019-07-09 NOTE — Telephone Encounter (Signed)
Noted. Ready for Dr. Jaynee Eagles to review.

## 2019-07-09 NOTE — Telephone Encounter (Signed)
R/c pt cd from [facility] pt cd on [place] desk

## 2019-07-25 ENCOUNTER — Ambulatory Visit (HOSPITAL_COMMUNITY)
Admission: RE | Admit: 2019-07-25 | Discharge: 2019-07-25 | Disposition: A | Discharge status: Home / Self Care | Payer: Medicare Other | Source: Ambulatory Visit | Attending: Neurology | Admitting: Neurology

## 2019-07-25 ENCOUNTER — Encounter (HOSPITAL_COMMUNITY)
Admission: RE | Admit: 2019-07-25 | Discharge: 2019-07-25 | Disposition: A | Discharge status: Home / Self Care | Payer: Medicare Other | Source: Ambulatory Visit | Attending: Neurology | Admitting: Neurology

## 2019-07-25 ENCOUNTER — Other Ambulatory Visit: Payer: Self-pay

## 2019-07-25 DIAGNOSIS — G20 Parkinson's disease: Secondary | ICD-10-CM | POA: Insufficient documentation

## 2019-07-25 DIAGNOSIS — R413 Other amnesia: Secondary | ICD-10-CM | POA: Insufficient documentation

## 2019-07-25 DIAGNOSIS — G3183 Dementia with Lewy bodies: Secondary | ICD-10-CM | POA: Diagnosis present

## 2019-07-25 MED ORDER — IODINE STRONG (LUGOLS) 5 % PO SOLN
0.8000 mL | Freq: Once | ORAL | Status: AC
  Administered 2019-07-25: 0.8 mL via ORAL

## 2019-07-25 MED ORDER — IODINE STRONG (LUGOLS) 5 % PO SOLN
ORAL | Status: AC
  Filled 2019-07-25: qty 1

## 2019-10-31 ENCOUNTER — Encounter: Payer: Medicare Other | Attending: Psychology | Admitting: Psychology

## 2020-01-22 IMAGING — NM NUCLEAR MEDICINE DATSCAN
4 series · 33 of 40 positions shown · non-contrast
Comparison: Brain MRI 05/31/2019

CLINICAL DATA: SHORT TERM MEMORY LOSS HAD BIL HAND TREMORS SEVERAL
YRS AGO THAT HAS STOPPED WITH MEDS GAIT ISSUES: SHUFFLING FEET X 3
MONTHS, FREQUENT FALLS

EXAM:
NUCLEAR MEDICINE BRAIN IMAGING WITH SPECT  (DaTscan )
TECHNIQUE: SPECT images of the brain were obtained after intravenous injection
of radiopharmaceutical. 4 hour post injection imaging. Appropriate
positioning. 0.8 ml lugols solution administered in a.m
RADIOPHARMACEUTICALS:  4.3 millicuries I 123 Ioflupane

[Series 1: spect tra_tra · 4.1mm · 4.14mm/px · 5 of 128 frames shown]
[frame 11/128]
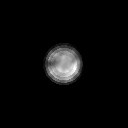
[frame 32/128]
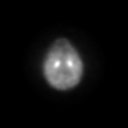
[frame 75/128]
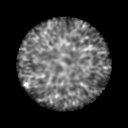
[frame 96/128]
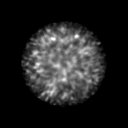
[frame 118/128]
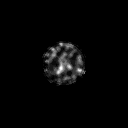

[Series 1: spect - (id) _(id)_cor · 4.1mm · 4.14mm/px · 5 of 128 frames shown]
[frame 11/128]
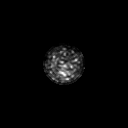
[frame 32/128]
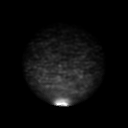
[frame 75/128]
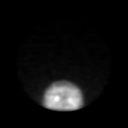
[frame 96/128]
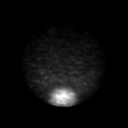
[frame 118/128]
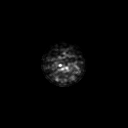

[Series 1: brain spect · 4.14mm/px · 5 of 120 frames shown]
[frame 11/120  full-range]
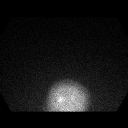
[frame 31/120  full-range]
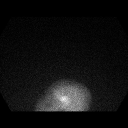
[frame 71/120  full-range]
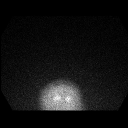
[frame 91/120  full-range]
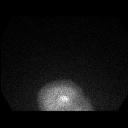
[frame 111/120  full-range]
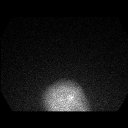

[Series 1016: mpr (id) range · 0.46mm/px · 18 of 40 slices shown]
[im 1/40]
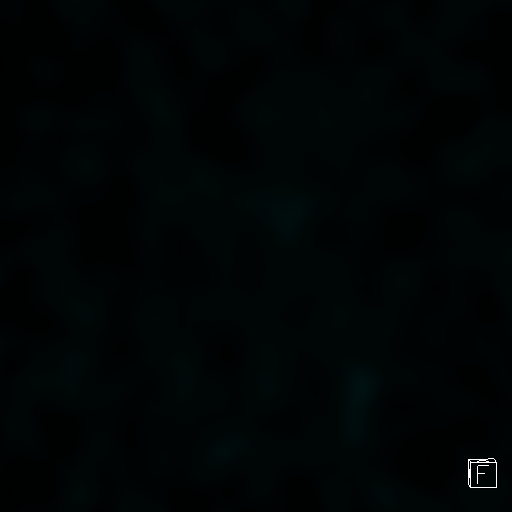
[im 4/40]
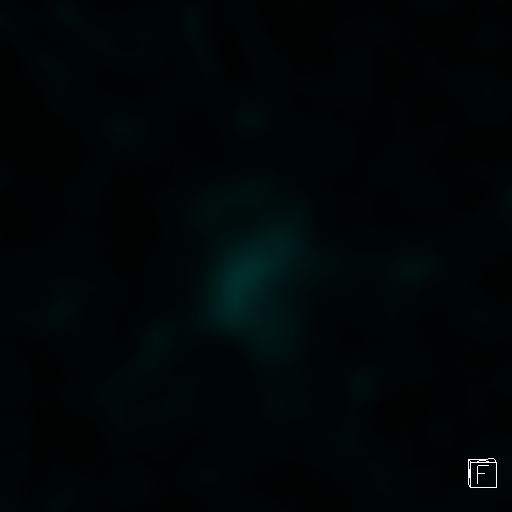
[im 6/40]
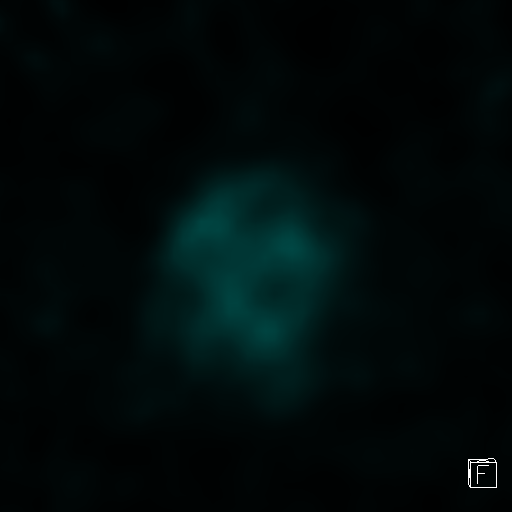
[im 8/40]
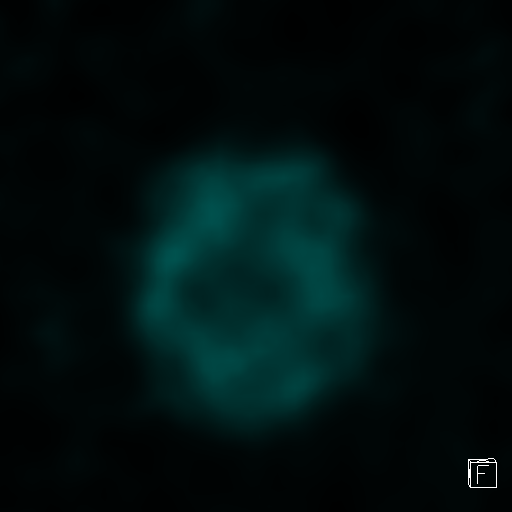
[im 10/40]
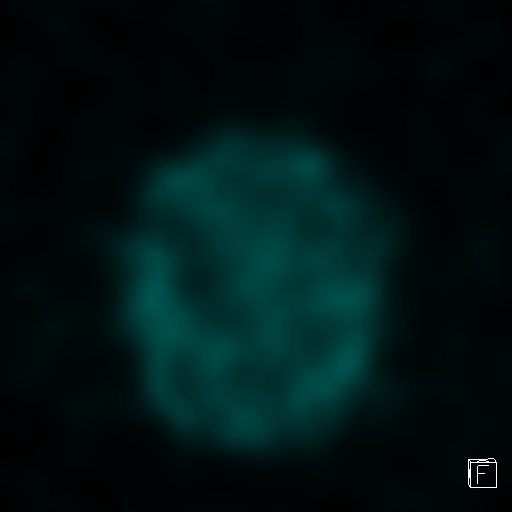
[im 12/40]
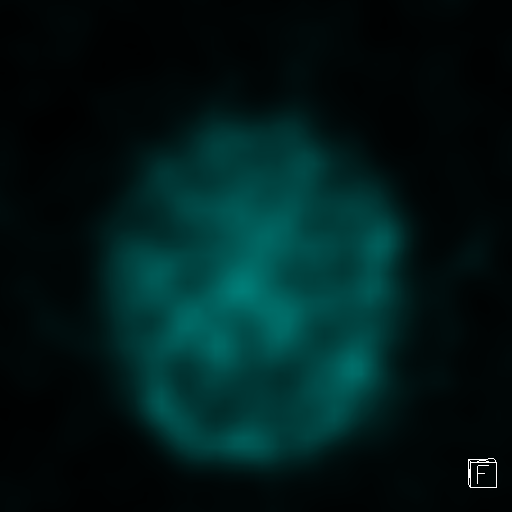
[im 15/40]
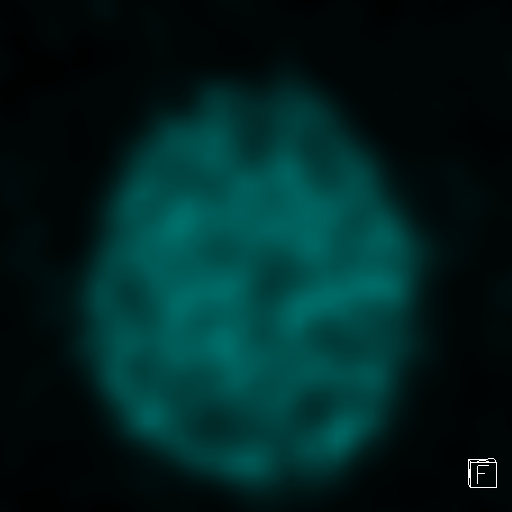
[im 17/40]
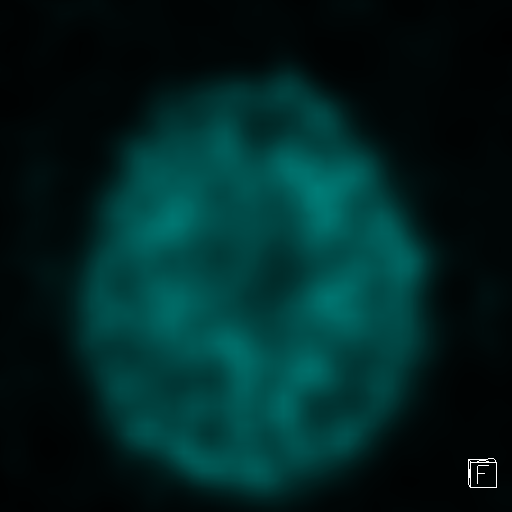
[im 19/40]
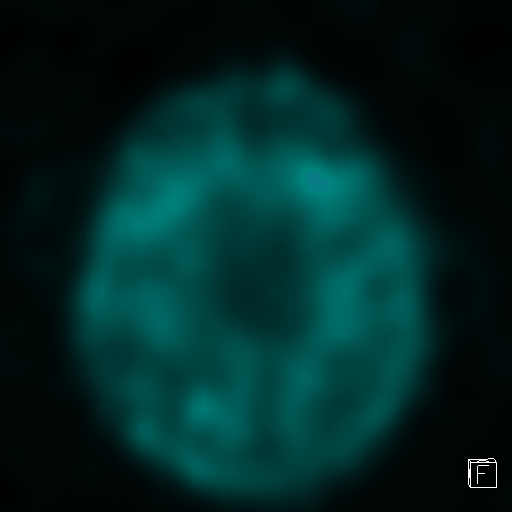
[im 21/40]
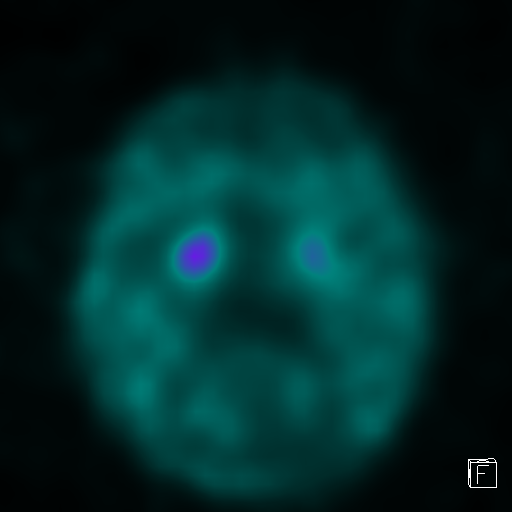
[im 23/40]
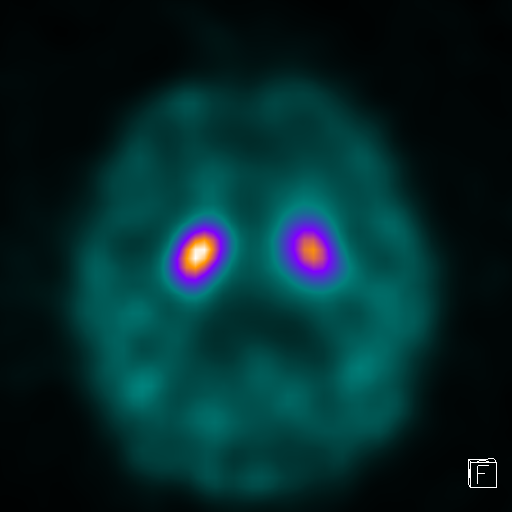
[im 27/40]
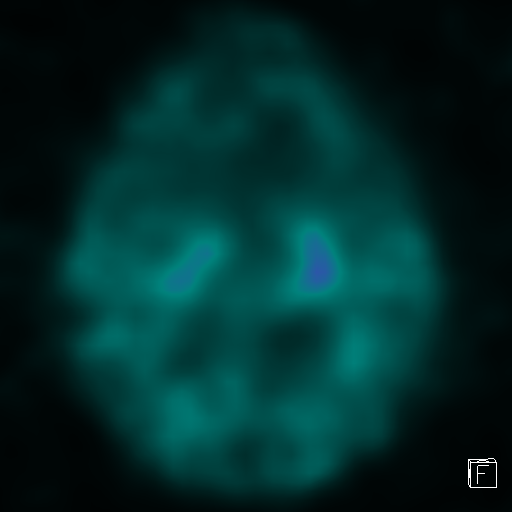
[im 28/40]
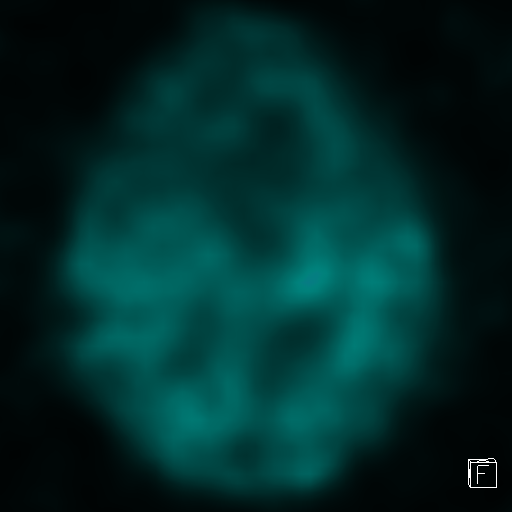
[im 30/40]
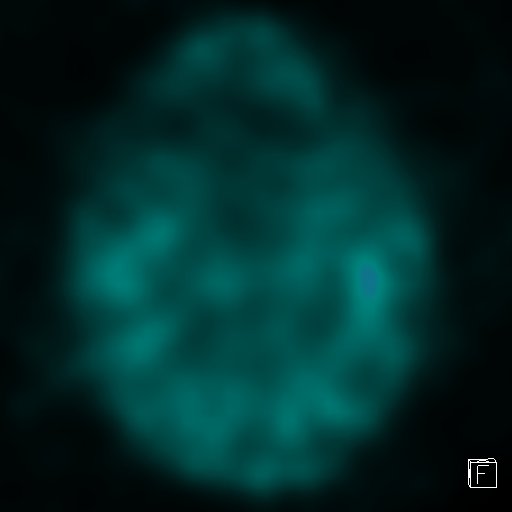
[im 32/40]
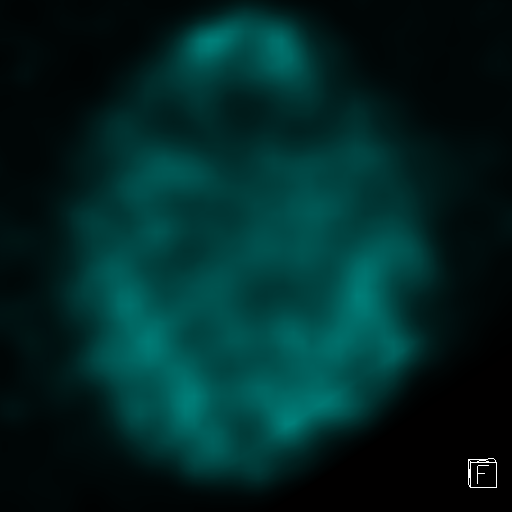
[im 34/40]
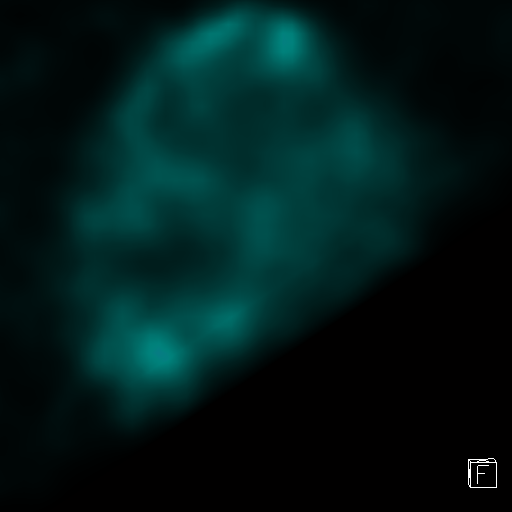
[im 38/40]
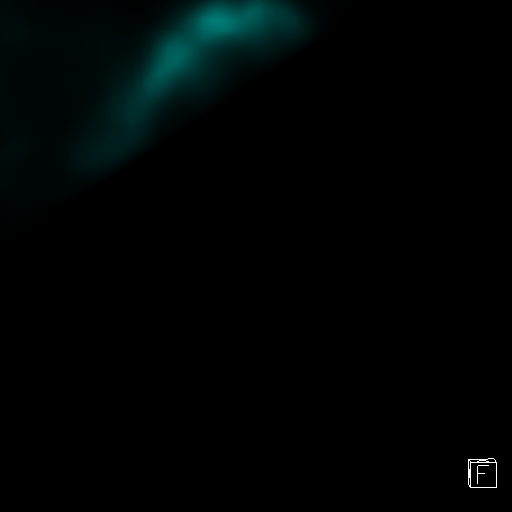
[im 40/40]
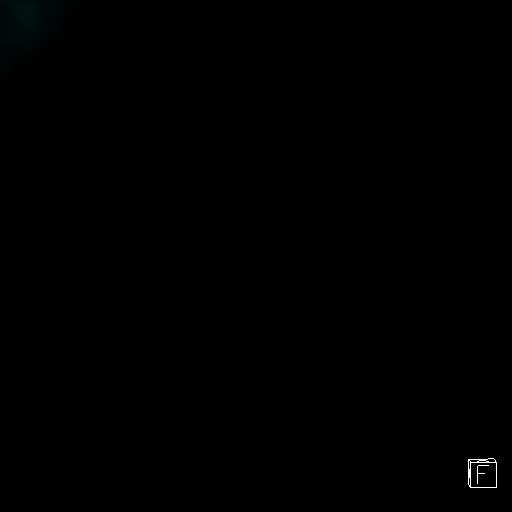

[33 of 40 positions shown; findings below may reference images not displayed]

FINDINGS: Symmetric intense uptake within LEFT and RIGHT striata. The heads of
the caudate nuclei and the posterior striata (putamen) are normal
shape. No evidence of loss of dopamine transport populations in the
basal ganglia.
IMPRESSION: Normal DaTSCAN with no evidence of loss dopamine transporter
populations within striata.
